# Patient Record
Sex: Female | Born: 1945
Health system: Southern US, Community
[De-identification: ages and names within clinical notes are randomized; demographics above are authoritative.]

## PROBLEM LIST (undated history)

## (undated) DIAGNOSIS — E079 Disorder of thyroid, unspecified: Secondary | ICD-10-CM

## (undated) DIAGNOSIS — C801 Malignant (primary) neoplasm, unspecified: Secondary | ICD-10-CM

## (undated) DIAGNOSIS — Z5189 Encounter for other specified aftercare: Secondary | ICD-10-CM

## (undated) DIAGNOSIS — IMO0001 Reserved for inherently not codable concepts without codable children: Secondary | ICD-10-CM

## (undated) DIAGNOSIS — I10 Essential (primary) hypertension: Secondary | ICD-10-CM

## (undated) HISTORY — DX: Malignant (primary) neoplasm, unspecified: C80.1

## (undated) HISTORY — DX: Reserved for inherently not codable concepts without codable children: IMO0001

## (undated) HISTORY — DX: Encounter for other specified aftercare: Z51.89

## (undated) HISTORY — PX: MANDIBLE SURGERY: SHX707

---

## 2000-01-14 HISTORY — PX: BREAST SURGERY: SHX581

## 2000-02-27 ENCOUNTER — Ambulatory Visit (HOSPITAL_COMMUNITY): Admission: RE | Admit: 2000-02-27 | Discharge: 2000-02-27 | Payer: Self-pay | Admitting: *Deleted

## 2001-01-23 ENCOUNTER — Encounter: Admission: RE | Admit: 2001-01-23 | Discharge: 2001-04-23 | Payer: Self-pay | Admitting: *Deleted

## 2001-01-23 ENCOUNTER — Encounter: Payer: Self-pay | Admitting: Surgery

## 2001-01-23 ENCOUNTER — Encounter: Admission: RE | Admit: 2001-01-23 | Discharge: 2001-01-23 | Payer: Self-pay | Admitting: Surgery

## 2001-02-12 ENCOUNTER — Ambulatory Visit (HOSPITAL_COMMUNITY): Admission: RE | Admit: 2001-02-12 | Discharge: 2001-02-12 | Payer: Self-pay | Admitting: Surgery

## 2001-02-12 ENCOUNTER — Encounter: Payer: Self-pay | Admitting: Surgery

## 2001-03-05 ENCOUNTER — Ambulatory Visit (HOSPITAL_COMMUNITY): Admission: RE | Admit: 2001-03-05 | Discharge: 2001-03-05 | Payer: Self-pay | Admitting: *Deleted

## 2001-03-05 ENCOUNTER — Encounter: Payer: Self-pay | Admitting: *Deleted

## 2001-03-17 ENCOUNTER — Ambulatory Visit (HOSPITAL_BASED_OUTPATIENT_CLINIC_OR_DEPARTMENT_OTHER): Admission: RE | Admit: 2001-03-17 | Discharge: 2001-03-17 | Payer: Self-pay | Admitting: Surgery

## 2001-03-17 ENCOUNTER — Encounter: Payer: Self-pay | Admitting: Surgery

## 2001-05-22 ENCOUNTER — Ambulatory Visit: Admission: RE | Admit: 2001-05-22 | Discharge: 2001-08-20 | Payer: Self-pay | Admitting: *Deleted

## 2001-12-08 ENCOUNTER — Ambulatory Visit (HOSPITAL_BASED_OUTPATIENT_CLINIC_OR_DEPARTMENT_OTHER): Admission: RE | Admit: 2001-12-08 | Discharge: 2001-12-08 | Payer: Self-pay | Admitting: Surgery

## 2003-11-16 ENCOUNTER — Ambulatory Visit (HOSPITAL_COMMUNITY): Admission: RE | Admit: 2003-11-16 | Discharge: 2003-11-16 | Payer: Self-pay | Admitting: Oncology

## 2004-11-13 ENCOUNTER — Ambulatory Visit: Payer: Self-pay | Admitting: Oncology

## 2005-03-13 ENCOUNTER — Ambulatory Visit (HOSPITAL_COMMUNITY): Admission: RE | Admit: 2005-03-13 | Discharge: 2005-03-13 | Payer: Self-pay | Admitting: *Deleted

## 2005-05-14 ENCOUNTER — Ambulatory Visit: Payer: Self-pay | Admitting: Oncology

## 2005-05-17 ENCOUNTER — Ambulatory Visit (HOSPITAL_COMMUNITY): Admission: RE | Admit: 2005-05-17 | Discharge: 2005-05-17 | Payer: Self-pay | Admitting: Oncology

## 2005-05-22 ENCOUNTER — Ambulatory Visit (HOSPITAL_COMMUNITY): Admission: RE | Admit: 2005-05-22 | Discharge: 2005-05-22 | Payer: Self-pay | Admitting: Oncology

## 2005-09-17 ENCOUNTER — Ambulatory Visit: Payer: Self-pay | Admitting: Oncology

## 2005-11-06 ENCOUNTER — Ambulatory Visit (HOSPITAL_COMMUNITY): Admission: RE | Admit: 2005-11-06 | Discharge: 2005-11-06 | Payer: Self-pay | Admitting: Plastic Surgery

## 2005-11-06 ENCOUNTER — Encounter: Admission: RE | Admit: 2005-11-06 | Discharge: 2005-11-06 | Payer: Self-pay | Admitting: Plastic Surgery

## 2005-11-12 ENCOUNTER — Ambulatory Visit: Payer: Self-pay | Admitting: Oncology

## 2006-05-29 ENCOUNTER — Ambulatory Visit: Payer: Self-pay | Admitting: Oncology

## 2006-05-31 LAB — CBC WITH DIFFERENTIAL/PLATELET
BASO%: 0.5 % (ref 0.0–2.0)
Basophils Absolute: 0 10*3/uL (ref 0.0–0.1)
EOS%: 1.2 % (ref 0.0–7.0)
HCT: 42.1 % (ref 34.8–46.6)
HGB: 14.5 g/dL (ref 11.6–15.9)
MCH: 33.6 pg (ref 26.0–34.0)
MCHC: 34.5 g/dL (ref 32.0–36.0)
MCV: 97.5 fL (ref 81.0–101.0)
NEUT#: 2.9 10*3/uL (ref 1.5–6.5)
NEUT%: 51.8 % (ref 39.6–76.8)
lymph#: 2 10*3/uL (ref 0.9–3.3)

## 2006-05-31 LAB — COMPREHENSIVE METABOLIC PANEL
AST: 26 U/L (ref 0–37)
Alkaline Phosphatase: 54 U/L (ref 39–117)
BUN: 13 mg/dL (ref 6–23)
CO2: 29 mEq/L (ref 19–32)
Calcium: 10.1 mg/dL (ref 8.4–10.5)
Creatinine, Ser: 0.8 mg/dL (ref 0.40–1.20)

## 2006-05-31 LAB — LACTATE DEHYDROGENASE: LDH: 143 U/L (ref 94–250)

## 2006-11-20 ENCOUNTER — Ambulatory Visit: Payer: Self-pay | Admitting: Oncology

## 2006-11-29 LAB — CBC WITH DIFFERENTIAL/PLATELET
BASO%: 0.4 % (ref 0.0–2.0)
HCT: 42.3 % (ref 34.8–46.6)
HGB: 14.8 g/dL (ref 11.6–15.9)
MCHC: 35 g/dL (ref 32.0–36.0)
MCV: 95.1 fL (ref 81.0–101.0)
MONO#: 0.5 10*3/uL (ref 0.1–0.9)
MONO%: 9.3 % (ref 0.0–13.0)
NEUT#: 2.7 10*3/uL (ref 1.5–6.5)
Platelets: 296 10*3/uL (ref 145–400)
WBC: 5 10*3/uL (ref 3.9–10.0)
lymph#: 1.8 10*3/uL (ref 0.9–3.3)

## 2006-11-29 LAB — COMPREHENSIVE METABOLIC PANEL
AST: 21 U/L (ref 0–37)
Albumin: 4.4 g/dL (ref 3.5–5.2)
BUN: 10 mg/dL (ref 6–23)
CO2: 27 mEq/L (ref 19–32)
Calcium: 9.6 mg/dL (ref 8.4–10.5)
Chloride: 100 mEq/L (ref 96–112)
Creatinine, Ser: 0.72 mg/dL (ref 0.40–1.20)
Glucose, Bld: 98 mg/dL (ref 70–99)
Potassium: 3.9 mEq/L (ref 3.5–5.3)
Sodium: 138 mEq/L (ref 135–145)
Total Bilirubin: 0.5 mg/dL (ref 0.3–1.2)
Total Protein: 7.1 g/dL (ref 6.0–8.3)

## 2007-05-29 ENCOUNTER — Ambulatory Visit: Payer: Self-pay | Admitting: Oncology

## 2007-06-02 LAB — CBC WITH DIFFERENTIAL/PLATELET
BASO%: 0.6 % (ref 0.0–2.0)
HCT: 41.7 % (ref 34.8–46.6)
HGB: 14.8 g/dL (ref 11.6–15.9)
LYMPH%: 37.5 % (ref 14.0–48.0)
MCHC: 35.4 g/dL (ref 32.0–36.0)
MCV: 95.3 fL (ref 81.0–101.0)
NEUT%: 51.4 % (ref 39.6–76.8)
RBC: 4.37 10*6/uL (ref 3.70–5.32)
WBC: 5.3 10*3/uL (ref 3.9–10.0)
lymph#: 2 10*3/uL (ref 0.9–3.3)

## 2007-06-02 LAB — COMPREHENSIVE METABOLIC PANEL
ALT: 27 U/L (ref 0–35)
Alkaline Phosphatase: 46 U/L (ref 39–117)
BUN: 11 mg/dL (ref 6–23)
Chloride: 102 mEq/L (ref 96–112)
Glucose, Bld: 112 mg/dL — ABNORMAL HIGH (ref 70–99)
Potassium: 3.6 mEq/L (ref 3.5–5.3)
Total Bilirubin: 0.6 mg/dL (ref 0.3–1.2)

## 2007-12-03 ENCOUNTER — Ambulatory Visit: Payer: Self-pay | Admitting: Oncology

## 2007-12-08 LAB — COMPREHENSIVE METABOLIC PANEL
Albumin: 4.4 g/dL (ref 3.5–5.2)
BUN: 13 mg/dL (ref 6–23)
Calcium: 9.5 mg/dL (ref 8.4–10.5)
Creatinine, Ser: 0.78 mg/dL (ref 0.40–1.20)
Glucose, Bld: 95 mg/dL (ref 70–99)
Sodium: 140 mEq/L (ref 135–145)
Total Bilirubin: 0.6 mg/dL (ref 0.3–1.2)

## 2007-12-08 LAB — LACTATE DEHYDROGENASE: LDH: 130 U/L (ref 94–250)

## 2007-12-08 LAB — CBC WITH DIFFERENTIAL/PLATELET
HCT: 42.4 % (ref 34.8–46.6)
HGB: 14.7 g/dL (ref 11.6–15.9)
MCH: 33.8 pg (ref 26.0–34.0)
MONO#: 0.5 10*3/uL (ref 0.1–0.9)
NEUT#: 2.8 10*3/uL (ref 1.5–6.5)
Platelets: 293 10*3/uL (ref 145–400)
WBC: 5.4 10*3/uL (ref 3.9–10.0)

## 2008-12-03 ENCOUNTER — Ambulatory Visit: Payer: Self-pay | Admitting: Oncology

## 2008-12-07 ENCOUNTER — Ambulatory Visit (HOSPITAL_COMMUNITY): Admission: RE | Admit: 2008-12-07 | Discharge: 2008-12-07 | Payer: Self-pay | Admitting: Oncology

## 2008-12-07 LAB — LACTATE DEHYDROGENASE: LDH: 119 U/L (ref 94–250)

## 2008-12-07 LAB — COMPREHENSIVE METABOLIC PANEL
AST: 17 U/L (ref 0–37)
Albumin: 4.3 g/dL (ref 3.5–5.2)
Alkaline Phosphatase: 55 U/L (ref 39–117)
BUN: 11 mg/dL (ref 6–23)
CO2: 30 mEq/L (ref 19–32)
Chloride: 101 mEq/L (ref 96–112)
Creatinine, Ser: 0.74 mg/dL (ref 0.40–1.20)
Glucose, Bld: 104 mg/dL — ABNORMAL HIGH (ref 70–99)
Potassium: 3.8 mEq/L (ref 3.5–5.3)
Total Protein: 7.1 g/dL (ref 6.0–8.3)

## 2008-12-07 LAB — CBC WITH DIFFERENTIAL/PLATELET
Basophils Absolute: 0 10*3/uL (ref 0.0–0.1)
Eosinophils Absolute: 0 10*3/uL (ref 0.0–0.5)
MCH: 33.1 pg (ref 25.1–34.0)
NEUT#: 2.8 10*3/uL (ref 1.5–6.5)
NEUT%: 58.1 % (ref 38.4–76.8)
RBC: 4.36 10*6/uL (ref 3.70–5.45)
RDW: 13.3 % (ref 11.2–14.5)

## 2009-12-01 ENCOUNTER — Ambulatory Visit (HOSPITAL_BASED_OUTPATIENT_CLINIC_OR_DEPARTMENT_OTHER): Payer: MEDICARE | Admitting: Oncology

## 2009-12-05 LAB — COMPREHENSIVE METABOLIC PANEL
ALT: 17 U/L (ref 0–35)
AST: 17 U/L (ref 0–37)
Alkaline Phosphatase: 50 U/L (ref 39–117)
BUN: 10 mg/dL (ref 6–23)
Total Bilirubin: 0.4 mg/dL (ref 0.3–1.2)
Total Protein: 7.1 g/dL (ref 6.0–8.3)

## 2009-12-05 LAB — CBC WITH DIFFERENTIAL/PLATELET
EOS%: 1.3 % (ref 0.0–7.0)
LYMPH%: 32.4 % (ref 14.0–49.7)
MCHC: 34.9 g/dL (ref 31.5–36.0)
MCV: 96.2 fL (ref 79.5–101.0)
MONO#: 0.6 10*3/uL (ref 0.1–0.9)
MONO%: 11.2 % (ref 0.0–14.0)
NEUT#: 2.7 10*3/uL (ref 1.5–6.5)
Platelets: 251 10*3/uL (ref 145–400)
RDW: 13.1 % (ref 11.2–14.5)
WBC: 5 10*3/uL (ref 3.9–10.3)

## 2010-12-05 ENCOUNTER — Encounter (HOSPITAL_BASED_OUTPATIENT_CLINIC_OR_DEPARTMENT_OTHER): Payer: MEDICARE | Admitting: Oncology

## 2010-12-05 DIAGNOSIS — Z803 Family history of malignant neoplasm of breast: Secondary | ICD-10-CM

## 2010-12-05 DIAGNOSIS — C50419 Malignant neoplasm of upper-outer quadrant of unspecified female breast: Secondary | ICD-10-CM

## 2010-12-05 DIAGNOSIS — M899 Disorder of bone, unspecified: Secondary | ICD-10-CM

## 2010-12-05 LAB — COMPREHENSIVE METABOLIC PANEL
ALT: 26 U/L (ref 0–35)
AST: 27 U/L (ref 0–37)
Calcium: 9.5 mg/dL (ref 8.4–10.5)
Chloride: 96 mEq/L (ref 96–112)
Total Protein: 6.8 g/dL (ref 6.0–8.3)

## 2010-12-05 LAB — CBC WITH DIFFERENTIAL/PLATELET
LYMPH%: 30.7 % (ref 14.0–49.7)
MCH: 33 pg (ref 25.1–34.0)
MCHC: 34.3 g/dL (ref 31.5–36.0)
MONO#: 0.4 10*3/uL (ref 0.1–0.9)
MONO%: 8.7 % (ref 0.0–14.0)
NEUT#: 2.7 10*3/uL (ref 1.5–6.5)
Platelets: 267 10*3/uL (ref 145–400)
lymph#: 1.4 10*3/uL (ref 0.9–3.3)

## 2010-12-05 LAB — LACTATE DEHYDROGENASE: LDH: 119 U/L (ref 94–250)

## 2011-02-07 ENCOUNTER — Encounter (INDEPENDENT_AMBULATORY_CARE_PROVIDER_SITE_OTHER): Payer: Self-pay | Admitting: Surgery

## 2011-03-02 NOTE — Procedures (Signed)
[facility]  Patient:    NEIDY, GUERRIERI                         MRN: [id number] Adm. Date:  [id number] Attending:  Sabino Gasser                           Procedure Report  PROCEDURE:  Colonoscopy.  ENDOSCOPIST:  Sabino Gasser, M.D.  INDICATION:  Colon polyp history with resection in the past.  ANESTHESIA:  Demerol 50 mg and Versed 6 mg were given intravenously in divided dose.  DESCRIPTION OF PROCEDURE:  With patient mildly sedated in the left lateral decubitus position, the Olympus videoscopic colonoscope was inserted in the rectum and passed under direct vision into the cecum, cecum identified by ileocecal valve and appendiceal orifice, both of which were photographed. From this point, the colonoscope was slowly withdrawn, taking circumferential views of the entire colonic mucosa, stopping only when we reached the rectum, which appeared normal on direct and retroflexed views.  The endoscope was straightened and withdrawn.  Patients vital signs and pulse oximetry remained stable.  Patient tolerated procedure well without apparent complications.  FINDINGS:  Essentially negative colonoscopic examination to the cecum.  PLAN:  Repeat exam in five years. DD:  02/15/00 TD:  02/28/00 Job: [id number]

## 2011-03-02 NOTE — Op Note (Signed)
NAME:  [name], Cheryl                  ACCOUNT NO.:  [id number]   MEDICAL RECORD NO.:  [id number]          PATIENT TYPE:  AMB   LOCATION:  ENDO                         FACILITY:  [facility]   PHYSICIAN:  Georgiana Spinner, M.D.    DATE OF BIRTH:  [birth year]   DATE OF PROCEDURE:  DATE OF DISCHARGE:                                 OPERATIVE REPORT   PROCEDURE:  Colonoscopy.   INDICATION:  Colon cancer screening.   ANESTHESIA:  Demerol 50 mg, Versed 5 mg.   DESCRIPTION OF PROCEDURE:  With the patient mildly sedated in the left  lateral decubitus position, the Olympus videoscopic colonoscope was inserted  into the rectum, passed under direct vision to the cecum identified by the  ileocecal valve and appendiceal orifice both of which were photographed.  From this point the colonoscope was slowly withdrawn, taking circumferential  views of the colonic mucosa and stopping only in the rectum which appeared  normal on direct and retroflexed view.  The endoscope was straightened and  withdrawn.  The patient's vital signs and pulse oximeter remained stable,  the patient tolerated the procedure well without apparent complications.   FINDINGS:  Occasional diverticulum in sigmoid colon, otherwise an  unremarkable colonoscopic examination to the cecum.   PLAN:  Consider repeat examination possibly in 5 years.      GMO/MEDQ  D:  03/13/2005  T:  03/13/2005  Job:  [id number]   cc:   Samul Dada, M.D.  [address] [facility]  [place]  [place] [address]  Fax: [phone]

## 2011-12-19 ENCOUNTER — Ambulatory Visit (INDEPENDENT_AMBULATORY_CARE_PROVIDER_SITE_OTHER): Payer: Medicare Other | Admitting: Surgery

## 2011-12-19 ENCOUNTER — Encounter (INDEPENDENT_AMBULATORY_CARE_PROVIDER_SITE_OTHER): Payer: Self-pay | Admitting: Surgery

## 2011-12-19 VITALS — BP 118/76 | HR 68 | Temp 97.8°F | Resp 18 | Ht 60.0 in | Wt 137.2 lb

## 2011-12-19 DIAGNOSIS — Z853 Personal history of malignant neoplasm of breast: Secondary | ICD-10-CM | POA: Insufficient documentation

## 2011-12-19 NOTE — Progress Notes (Signed)
[facility]  Ovidio Kin, MD,  FACS [address] [place]  [address] [place], [place]    [address] Phone:  [phone] FAX:  [phone]   Re:   Cheryl [name] DOB:   June 17, 1946 MRN:   [id number]  ASSESSMENT AND PLAN: 1.  Left breast cancer, lumpectomy May 2002  1.7 cm IDC with 0/2 nodes  ER/PR positve, Her2Neu - neg.  She took tamoxifen foe 3 years - then Femara.  She stopped the Femara in 2007.  She saw Dr. Aliene Altes first, then Dr. Arline Asp.  He has released her.  She had Rad Tx supervised by Dr. Jackelyn Knife.  She is disease free and will see me back in one year.  HISTORY OF PRESENT ILLNESS: Chief Complaint  Patient presents with  . Breast Cancer [place] Term Follow Up    Recheck    [name] is a 66 y.o. (DOB: [birth year])  white female who is a patient of PANG,RICHARD, MD, MD and comes to me today for left breast cancer follow up.  She is doing well with no complaints.  She has no new mass or concern.  PHYSICAL EXAM: BP 118/76  Pulse 68  Temp(Src) 97.8 F (36.6 C) (Temporal)  Resp 18  Ht 5' (1.524 m)  Wt 137 lb 3.2 oz (62.234 kg)  BMI 26.80 kg/m2  HEENT:  Pupils equal.  Dentition good.  No injury. NECK:  Supple.  No thyroid mass. LYMPH NODES:  No cervical, supraclavicular, or axillary adenopathy. BREASTS -  RIGHT:  Has changes of breast reduction. No palpable mass or nodule.  No nipple discharge.   LEFT: Defect UOQ of left breast with thickening under the scar.  No nipple discharge. UPPER EXTREMITIES:  No evidence of lymphedema.  DATA REVIEWED: Mammogram - [facility] - 09/27/2011 was negative.   Ovidio Kin, MD, FACS Office:  [phone]

## 2011-12-24 ENCOUNTER — Encounter (INDEPENDENT_AMBULATORY_CARE_PROVIDER_SITE_OTHER): Payer: Self-pay

## 2012-12-17 ENCOUNTER — Telehealth (INDEPENDENT_AMBULATORY_CARE_PROVIDER_SITE_OTHER): Payer: Self-pay

## 2012-12-17 NOTE — Telephone Encounter (Signed)
Pt aware of appt with Dr. Ezzard Standing 01/07/13@ 10:15

## 2013-01-07 ENCOUNTER — Ambulatory Visit (INDEPENDENT_AMBULATORY_CARE_PROVIDER_SITE_OTHER): Payer: Medicare Other | Admitting: Surgery

## 2013-01-07 ENCOUNTER — Encounter (INDEPENDENT_AMBULATORY_CARE_PROVIDER_SITE_OTHER): Payer: Self-pay | Admitting: Surgery

## 2013-01-07 VITALS — BP 140/64 | HR 72 | Temp 98.4°F | Resp 16 | Ht 60.0 in | Wt 138.0 lb

## 2013-01-07 DIAGNOSIS — Z853 Personal history of malignant neoplasm of breast: Secondary | ICD-10-CM

## 2013-01-07 NOTE — Progress Notes (Signed)
[facility]  Cheryl Kin, MD,  FACS [address] [place]  [address] [place], [place]    [address] Phone:  [phone] FAX:  [phone]   Re:   Cheryl [name] DOB:   [birth year] MRN:   [id number]  ASSESSMENT AND PLAN: 1.  Left breast cancer, lumpectomy May 2002  1.7 cm IDC with 0/2 nodes  ER/PR positve, Her2Neu - neg.  She took tamoxifen for 3 years - then Femara.  She stopped the Femara in 2007.  She saw Dr. Aliene Altes first, then Dr. Arline Asp.  He has released her.  She had Rad Tx supervised by Dr. Jackelyn Knife.  She is disease free and will see me back in one year.  HISTORY OF PRESENT ILLNESS: Chief Complaint  Patient presents with  . Breast Cancer [place] Term Follow Up   Cheryl [name] is a 67 y.o. (DOB: 03-Nov-1945)  white female who is a patient of PANG,RICHARD, MD and comes to me today for left breast cancer follow up.  She is doing well with no complaints.  She has no new mass or concern.  We talked about the continued cold weather.  PHYSICAL EXAM: BP 140/64  Pulse 72  Temp(Src) 98.4 F (36.9 C) (Temporal)  Resp 16  Ht 5' (1.524 m)  Wt 138 lb (62.596 kg)  BMI 26.95 kg/m2  HEENT:  Pupils equal.  Dentition good.   NECK:  Supple.  No thyroid mass. LYMPH NODES:  No cervical, supraclavicular, or axillary adenopathy. BREASTS -  RIGHT:  Has changes of breast reduction. No palpable mass or nodule.  No nipple discharge.   LEFT: Defect UOQ of left breast with thickening under the scar.  No nipple discharge. UPPER EXTREMITIES:  No evidence of lymphedema.  DATA REVIEWED: Mammogram - [facility] - 09/29/2012 - negative.  Next mammogram in 1 year.  Cheryl Kin, MD, FACS Office:  [phone]

## 2013-02-09 ENCOUNTER — Encounter (INDEPENDENT_AMBULATORY_CARE_PROVIDER_SITE_OTHER): Payer: Self-pay

## 2013-12-11 ENCOUNTER — Encounter (INDEPENDENT_AMBULATORY_CARE_PROVIDER_SITE_OTHER): Payer: Self-pay

## 2014-01-02 ENCOUNTER — Encounter (HOSPITAL_BASED_OUTPATIENT_CLINIC_OR_DEPARTMENT_OTHER): Payer: Self-pay | Admitting: Emergency Medicine

## 2014-01-02 ENCOUNTER — Emergency Department (HOSPITAL_BASED_OUTPATIENT_CLINIC_OR_DEPARTMENT_OTHER): Payer: Medicare Other

## 2014-01-02 ENCOUNTER — Emergency Department (HOSPITAL_BASED_OUTPATIENT_CLINIC_OR_DEPARTMENT_OTHER)
Admission: EM | Admit: 2014-01-02 | Discharge: 2014-01-02 | Disposition: A | Discharge status: Home / Self Care | Payer: Medicare Other | Attending: Emergency Medicine | Admitting: Emergency Medicine

## 2014-01-02 DIAGNOSIS — K5732 Diverticulitis of large intestine without perforation or abscess without bleeding: Secondary | ICD-10-CM | POA: Insufficient documentation

## 2014-01-02 DIAGNOSIS — E079 Disorder of thyroid, unspecified: Secondary | ICD-10-CM | POA: Insufficient documentation

## 2014-01-02 DIAGNOSIS — Z853 Personal history of malignant neoplasm of breast: Secondary | ICD-10-CM | POA: Insufficient documentation

## 2014-01-02 DIAGNOSIS — Z79899 Other long term (current) drug therapy: Secondary | ICD-10-CM | POA: Insufficient documentation

## 2014-01-02 DIAGNOSIS — I10 Essential (primary) hypertension: Secondary | ICD-10-CM | POA: Insufficient documentation

## 2014-01-02 DIAGNOSIS — K5792 Diverticulitis of intestine, part unspecified, without perforation or abscess without bleeding: Secondary | ICD-10-CM

## 2014-01-02 DIAGNOSIS — Z7982 Long term (current) use of aspirin: Secondary | ICD-10-CM | POA: Insufficient documentation

## 2014-01-02 HISTORY — DX: Disorder of thyroid, unspecified: E07.9

## 2014-01-02 HISTORY — DX: Essential (primary) hypertension: I10

## 2014-01-02 LAB — COMPREHENSIVE METABOLIC PANEL
ALT: 14 U/L (ref 0–35)
AST: 14 U/L (ref 0–37)
Albumin: 3.6 g/dL (ref 3.5–5.2)
Alkaline Phosphatase: 49 U/L (ref 39–117)
BUN: 10 mg/dL (ref 6–23)
CALCIUM: 9.5 mg/dL (ref 8.4–10.5)
CO2: 30 mEq/L (ref 19–32)
Chloride: 95 mEq/L — ABNORMAL LOW (ref 96–112)
Creatinine, Ser: 0.7 mg/dL (ref 0.50–1.10)
GFR calc Af Amer: >90 mL/min (ref >90)
GFR calc non Af Amer: 86 mL/min — ABNORMAL LOW (ref >90)
GLUCOSE: 123 mg/dL — AB (ref 70–99)
Potassium: 3.2 mEq/L — ABNORMAL LOW (ref 3.7–5.3)
SODIUM: 139 meq/L (ref 137–147)
Total Bilirubin: 1.3 mg/dL — ABNORMAL HIGH (ref 0.3–1.2)
Total Protein: 8.1 g/dL (ref 6.0–8.3)

## 2014-01-02 LAB — CBC
HCT: 41.3 % (ref 36.0–46.0)
Hemoglobin: 14.1 g/dL (ref 12.0–15.0)
MCH: 33 pg (ref 26.0–34.0)
MCHC: 34.1 g/dL (ref 30.0–36.0)
MCV: 96.7 fL (ref 78.0–100.0)
PLATELETS: 251 10*3/uL (ref 150–400)
RBC: 4.27 MIL/uL (ref 3.87–5.11)
RDW: 12.7 % (ref 11.5–15.5)
WBC: 12 10*3/uL — ABNORMAL HIGH (ref 4.0–10.5)

## 2014-01-02 LAB — URINALYSIS, ROUTINE W REFLEX MICROSCOPIC
Glucose, UA: NEGATIVE mg/dL
Hgb urine dipstick: NEGATIVE
KETONES UR: 15 mg/dL — AB
NITRITE: NEGATIVE
PH: 8 (ref 5.0–8.0)
PROTEIN: 30 mg/dL — AB
Specific Gravity, Urine: 1.025 (ref 1.005–1.030)
Urobilinogen, UA: 1 mg/dL (ref 0.0–1.0)

## 2014-01-02 LAB — URINE MICROSCOPIC-ADD ON

## 2014-01-02 LAB — LIPASE, BLOOD: Lipase: 22 U/L (ref 11–59)

## 2014-01-02 MED ORDER — CIPROFLOXACIN HCL 500 MG PO TABS: 500.0000 mg | ORAL_TABLET | Freq: Two times a day (BID) | ORAL | Status: DC

## 2014-01-02 MED ORDER — IOHEXOL 300 MG/ML  SOLN
100.0000 mL | Freq: Once | INTRAMUSCULAR | Status: AC | PRN
  Administered 2014-01-02: 100 mL via INTRAVENOUS

## 2014-01-02 MED ORDER — METRONIDAZOLE IN NACL 5-0.79 MG/ML-% IV SOLN
500.0000 mg | Freq: Once | INTRAVENOUS | Status: AC
  Administered 2014-01-02: 500 mg via INTRAVENOUS
  Filled 2014-01-02: qty 100

## 2014-01-02 MED ORDER — IOHEXOL 300 MG/ML  SOLN
50.0000 mL | Freq: Once | INTRAMUSCULAR | Status: AC | PRN
  Administered 2014-01-02: 50 mL via ORAL

## 2014-01-02 MED ORDER — HYDROCODONE-ACETAMINOPHEN 5-325 MG PO TABS: 1.0000 | ORAL_TABLET | Freq: Four times a day (QID) | ORAL | Status: DC | PRN

## 2014-01-02 MED ORDER — SODIUM CHLORIDE 0.9 % IV BOLUS (SEPSIS)
1000.0000 mL | Freq: Once | INTRAVENOUS | Status: AC
  Administered 2014-01-02: 1000 mL via INTRAVENOUS

## 2014-01-02 MED ORDER — METRONIDAZOLE 500 MG PO TABS: 500.0000 mg | ORAL_TABLET | Freq: Three times a day (TID) | ORAL | Status: DC

## 2014-01-02 MED ORDER — CIPROFLOXACIN IN D5W 400 MG/200ML IV SOLN
400.0000 mg | Freq: Once | INTRAVENOUS | Status: AC
  Administered 2014-01-02: 400 mg via INTRAVENOUS
  Filled 2014-01-02: qty 200

## 2014-01-02 NOTE — Discharge Instructions (Signed)
Diverticulitis °A diverticulum is a small pouch or sac on the colon. Diverticulosis is the presence of these diverticula on the colon. Diverticulitis is the irritation (inflammation) or infection of diverticula. °CAUSES  °The colon and its diverticula contain bacteria. If food particles block the tiny opening to a diverticulum, the bacteria inside can grow and cause an increase in pressure. This leads to infection and inflammation and is called diverticulitis. °SYMPTOMS  °· Abdominal pain and tenderness. Usually, the pain is located on the left side of your abdomen. However, it could be located elsewhere. °· Fever. °· Bloating. °· Feeling sick to your stomach (nausea). °· Throwing up (vomiting). °· Abnormal stools. °DIAGNOSIS  °Your caregiver will take a history and perform a physical exam. Since many things can cause abdominal pain, other tests may be necessary. Tests may include: °· Blood tests. °· Urine tests. °· X-ray of the abdomen. °· CT scan of the abdomen. °Sometimes, surgery is needed to determine if diverticulitis or other conditions are causing your symptoms. °TREATMENT  °Most of the time, you can be treated without surgery. Treatment includes: °· Resting the bowels by only having liquids for a few days. As you improve, you will need to eat a low-fiber diet. °· Intravenous (IV) fluids if you are losing body fluids (dehydrated). °· Antibiotic medicines that treat infections may be given. °· Pain and nausea medicine, if needed. °· Surgery if the inflamed diverticulum has burst. °HOME CARE INSTRUCTIONS  °· Try a clear liquid diet (broth, tea, or water for as [place] as directed by your caregiver). You may then gradually begin a low-fiber diet as tolerated.  °A low-fiber diet is a diet with less than 10 grams of fiber. Choose the foods below to reduce fiber in the diet: °· White breads, cereals, rice, and pasta. °· Cooked fruits and vegetables or soft fresh fruits and vegetables without the skin. °· Ground or  well-cooked tender beef, ham, veal, lamb, pork, or poultry. °· Eggs and seafood. °· After your diverticulitis symptoms have improved, your caregiver may put you on a high-fiber diet. A high-fiber diet includes 14 grams of fiber for every 1000 calories consumed. For a standard 2000 calorie diet, you would need 28 grams of fiber. Follow these diet guidelines to help you increase the fiber in your diet. It is important to slowly increase the amount fiber in your diet to avoid gas, constipation, and bloating. °· Choose whole-grain breads, cereals, pasta, and brown rice. °· Choose fresh fruits and vegetables with the skin on. Do not overcook vegetables because the more vegetables are cooked, the more fiber is lost. °· Choose more nuts, seeds, legumes, dried peas, beans, and lentils. °· Look for food products that have greater than 3 grams of fiber per serving on the Nutrition Facts label. °· Take all medicine as directed by your caregiver. °· If your caregiver has given you a follow-up appointment, it is very important that you go. Not going could result in lasting (chronic) or permanent injury, pain, and disability. If there is any problem keeping the appointment, call to reschedule. °SEEK MEDICAL CARE IF:  °· Your pain does not improve. °· You have a hard time advancing your diet beyond clear liquids. °· Your bowel movements do not return to normal. °SEEK IMMEDIATE MEDICAL CARE IF:  °· Your pain becomes worse. °· You have an oral temperature above 102° F (38.9° C), not controlled by medicine. °· You have repeated vomiting. °· You have bloody or black, tarry stools. °·   Symptoms that brought you to your caregiver become worse or are not getting better. °MAKE SURE YOU:  °· Understand these instructions. °· Will watch your condition. °· Will get help right away if you are not doing well or get worse. °Document Released: 07/11/2005 Document Revised: 12/24/2011 Document Reviewed: 11/06/2010 °[employer]® Patient Information  ©2014 [employer]. ° °

## 2014-01-02 NOTE — ED Notes (Signed)
Patient c/o LUQ pain since Thur, no n/v/d, no constipation, tender,

## 2014-01-02 NOTE — ED Provider Notes (Signed)
CSN: [id number]     Arrival date & time 01/02/14  3762 History   First MD Initiated Contact with Patient 01/02/14 [phone]     Chief Complaint  Patient presents with  . Abdominal Pain     (Consider location/radiation/quality/duration/timing/severity/associated sxs/prior Treatment) Patient is a 68 y.o. female presenting with abdominal pain. The history is provided by the patient.  Abdominal Pain Pain location:  LUQ Pain quality: aching   Pain radiates to:  Does not radiate Pain severity:  Moderate Onset quality:  Sudden Duration:  2 days Timing:  Constant Progression:  Worsening Chronicity:  New Context: not alcohol use, not eating and not trauma   Relieved by:  Position changes Worsened by:  Nothing tried Ineffective treatments:  None tried Associated symptoms: fever (Tmax 100.6)   Associated symptoms: no chest pain, no constipation, no cough, no dysuria, no nausea, no shortness of breath, no vaginal bleeding, no vaginal discharge and no vomiting     Past Medical History  Diagnosis Date  . Blood transfusion   . Cancer     breast - left  . Thyroid disease   . Hypertension    Past Surgical History  Procedure Laterality Date  . Mandible surgery    . Breast surgery  01/2000    left   Family History  Problem Relation Age of Onset  . Heart disease Mother   . Heart disease Father   . Breast cancer Sister   . Cancer Sister     breast   History  Substance Use Topics  . Smoking status: Never Smoker   . Smokeless tobacco: Never Used  . Alcohol Use: 0.0 oz/week    0 Glasses of wine per week     Comment: wine - rare   OB History   Grav Para Term Preterm Abortions TAB SAB Ect Mult Living                 Review of Systems  Constitutional: Positive for fever (Tmax 100.6).  Respiratory: Negative for cough and shortness of breath.   Cardiovascular: Negative for chest pain.  Gastrointestinal: Positive for abdominal pain. Negative for nausea, vomiting, constipation and  blood in stool.  Genitourinary: Negative for dysuria, flank pain, vaginal bleeding, vaginal discharge and difficulty urinating.  All other systems reviewed and are negative.      Allergies  Review of patient's allergies indicates no known allergies.  Home Medications   Current Outpatient Rx  Name  Route  Sig  Dispense  Refill  . aspirin 81 MG tablet   Oral   Take 81 mg by mouth daily.           . Cinnamon 500 MG capsule   Oral   Take 1,000 mg by mouth daily.           . Flaxseed, Linseed, 1000 MG CAPS   Oral   Take by mouth 1 dose over 46 hours.           . hydrochlorothiazide 25 MG tablet   Oral   Take 25 mg by mouth daily.           Marland Kitchen levothyroxine (SYNTHROID, LEVOTHROID) 50 MCG tablet   Oral   Take 50 mcg by mouth daily.            BP 139/77  Pulse 118  Temp(Src) 98.4 F (36.9 C)  Resp 19  SpO2 98% Physical Exam  Nursing note and vitals reviewed. Constitutional: She is oriented to person, place, and  time. She appears well-developed and well-nourished. No distress.  HENT:  Head: Normocephalic and atraumatic.  Eyes: EOM are normal. Pupils are equal, round, and reactive to light.  Neck: Normal range of motion. Neck supple.  Cardiovascular: Normal rate and regular rhythm.  Exam reveals no friction rub.   No murmur heard. Pulmonary/Chest: Effort normal and breath sounds normal. No respiratory distress. She has no wheezes. She has no rales.  Abdominal: Soft. She exhibits no distension. There is tenderness (LUQ). There is no rebound.  No CVA tenderness  Musculoskeletal: Normal range of motion. She exhibits no edema.  Neurological: She is alert and oriented to person, place, and time.  Skin: She is not diaphoretic.    ED Course  Procedures (including critical care time) Labs Review Labs Reviewed  URINALYSIS, ROUTINE W REFLEX MICROSCOPIC  CBC  COMPREHENSIVE METABOLIC PANEL  LIPASE, BLOOD  I-STAT CG4 LACTIC ACID, ED   Imaging Review Dg Chest 2  View  01/02/2014   CLINICAL DATA:  Left upper quadrant pain for 2 days, history breast cancer, hypertension  EXAM: CHEST  2 VIEW  COMPARISON:  12/07/2008  FINDINGS: Normal heart size, mediastinal contours, and pulmonary vascularity.  Mild chronic peribronchial thickening.  No acute infiltrate, pleural effusion or pneumothorax.  Bones unremarkable.  IMPRESSION: Mild chronic bronchitic changes without infiltrate.   Electronically Signed   By: Lavonia Dana M.D.   On: 01/02/2014 10:06   Ct Abdomen Pelvis W Contrast  01/02/2014   CLINICAL DATA:  LUQ pain  EXAM: CT ABDOMEN AND PELVIS WITH CONTRAST  TECHNIQUE: Multidetector CT imaging of the abdomen and pelvis was performed using the standard protocol following bolus administration of intravenous contrast.  CONTRAST:  14mL OMNIPAQUE IOHEXOL 300 MG/ML SOLN, 175mL OMNIPAQUE IOHEXOL 300 MG/ML SOLN  COMPARISON:  None.  FINDINGS: The lung bases are unremarkable.  The liver demonstrates a diffuse low attenuating architecture otherwise unremarkable.  The spleen, adrenals, pancreas, kidneys are unremarkable.  There is bowel wall thickening involving the splenic flexure region of the colon. Inflammatory changes appreciated within the surrounding fat as well as a small amount of free fluid. No associated loculated fluid collections nor free air. There are regions of diverticulosis within the splenic flexure, descending and sigmoid portions of the colon.  There is no evidence of bowel obstruction, enteritis, nor appendicitis. The appendix is identified and is unremarkable.  There is no evidence of abdominal or pelvic masses, free fluid, loculated fluid collections. Scattered subcentimeter sized mesenteric lymph nodes and an 8 mm. Aortic lymph node appreciated.  There is no evidence of abdominal aortic aneurysm. Atherosclerotic calcifications are appreciated within the abdominal aorta as well as mural thrombus. The celiac, SMA, IMA, portal vein are opacified.  There is no evidence  of an abdominal wall nor inguinal hernia. The gallbladder fossa is unremarkable.  There is no evidence of aggressive appearing osseous lesions.  IMPRESSION: 1. Findings consistent with diverticulitis in the splenic flexure of the colon. There is no associated abscess nor pneumoperitoneum. 2. Reactive lymph nodes within the abdomen. 3. Hepatic steatosis 4. These results will be called to the ordering clinician or representative by the Radiologist Assistant, and communication documented in the PACS Dashboard.   Electronically Signed   By: Margaree Mackintosh M.D.   On: 01/02/2014 10:04     EKG Interpretation None      MDM   Final diagnoses:  Diverticulitis    68F presents with LUQ, presents for past 2 days. Worsening. No radiation, no migration. Denies vomiting,  diarrhea, dysuria, constipation, blood in stool. Associated fever to 100.6. Alleviating with position changes, no exacerbating factors. Afebrile, tachycardic up to 120, normotensive. On exam, LUQ tenderness without rebound or guarding. No CVA tenderness, no other abdominal pain. No cough or SOB, lungs clear. Will scan, check labs, obtain CXR. Patient does not want any pain medicine on initial eval. CT shows diverticulitis. Given Cipro and flagyl IV - can transition to PO and be discharged. Instructed to f/u with PCP.  I have reviewed all labs and imaging and considered them in my medical decision making.    Osvaldo Shipper, MD 01/02/14 1550

## 2014-01-04 LAB — I-STAT CG4 LACTIC ACID, ED: LACTIC ACID, VENOUS: 1.38 mmol/L (ref 0.5–2.2)

## 2014-02-17 ENCOUNTER — Other Ambulatory Visit (INDEPENDENT_AMBULATORY_CARE_PROVIDER_SITE_OTHER): Payer: Self-pay

## 2014-02-17 MED ORDER — UNABLE TO FIND: Status: AC

## 2014-04-21 ENCOUNTER — Encounter (INDEPENDENT_AMBULATORY_CARE_PROVIDER_SITE_OTHER): Payer: Self-pay | Admitting: Surgery

## 2014-04-21 ENCOUNTER — Ambulatory Visit (INDEPENDENT_AMBULATORY_CARE_PROVIDER_SITE_OTHER): Payer: Medicare Other | Admitting: Surgery

## 2014-04-21 VITALS — BP 126/80 | HR 93 | Ht 59.0 in | Wt 132.0 lb

## 2014-04-21 DIAGNOSIS — Z853 Personal history of malignant neoplasm of breast: Secondary | ICD-10-CM

## 2014-04-21 NOTE — Progress Notes (Signed)
Jack, MD,  [address]  [place], [place]    [place] Phone:  [phone] FAX:  [phone]   Re:   CLEOTA PELLERITO DOB:   12-18-1945 MRN:   [id number]  ASSESSMENT AND PLAN: 1.  Left breast cancer, lumpectomy May 2002  1.7 cm IDC with 0/2 nodes  ER/PR positve, Her2Neu - neg.  She took tamoxifen for 3 years - then Femara.  She stopped the Femara in 2007.  She saw Dr. Nadene Rubins first, then Dr. Ralene Ok.  He has released her.  She had Rad Tx supervised by Dr. Shelby Dubin.  She is now 13 years out from surgery.  I made this is her last visit.  I encouraged her to make sure that Dr. Minna Antis examines her breast on an annual basis.  She will call for any further problem.  HISTORY OF PRESENT ILLNESS: Chief Complaint  Patient presents with  . Breast Cancer [place] Term Follow Up   LILEIGH FAHRINGER is a 68 y.o. (DOB: 1946-08-19)  white female who is a patient of PANG,RICHARD, MD and comes to me today for left breast cancer follow up.  She is doing well with no complaints.  She has no new mass or concern.  I talked to her about follow up. She is beyond 10 years, is disease free, and there is no reason for her continued follow up with me. I reminded her to check herself, get annual mammograms, and have her PCP examine her breasts annually.  PHYSICAL EXAM: BP 126/80  Pulse 93  Ht 4\' 11"  (1.499 m)  Wt 132 lb (59.875 kg)  BMI 26.65 kg/m2  HEENT:  Pupils equal.  Dentition good.   NECK:  Supple.  No thyroid mass. LYMPH NODES:  No cervical, supraclavicular, or axillary adenopathy. BREASTS -  RIGHT:  Has changes of breast reduction. No palpable mass or nodule.  No nipple discharge.   LEFT: Defect UOQ of left breast with thickening under the scar.  No nipple discharge. UPPER EXTREMITIES:  No evidence of lymphedema.  DATA REVIEWED: Mammogram - [facility] - 09/27/2013 - negative.  Next mammogram in 1 year.  Alphonsa Overall, MD, [place] Office:   [phone]

## 2014-12-07 DIAGNOSIS — H3531 Nonexudative age-related macular degeneration: Secondary | ICD-10-CM | POA: Diagnosis not present

## 2014-12-07 DIAGNOSIS — H40013 Open angle with borderline findings, low risk, bilateral: Secondary | ICD-10-CM | POA: Diagnosis not present

## 2014-12-07 DIAGNOSIS — H02831 Dermatochalasis of right upper eyelid: Secondary | ICD-10-CM | POA: Diagnosis not present

## 2014-12-07 DIAGNOSIS — H02834 Dermatochalasis of left upper eyelid: Secondary | ICD-10-CM | POA: Diagnosis not present

## 2014-12-07 DIAGNOSIS — H43812 Vitreous degeneration, left eye: Secondary | ICD-10-CM | POA: Diagnosis not present

## 2015-01-31 DIAGNOSIS — H10411 Chronic giant papillary conjunctivitis, right eye: Secondary | ICD-10-CM | POA: Diagnosis not present

## 2015-01-31 DIAGNOSIS — H10021 Other mucopurulent conjunctivitis, right eye: Secondary | ICD-10-CM | POA: Diagnosis not present

## 2015-01-31 DIAGNOSIS — H0012 Chalazion right lower eyelid: Secondary | ICD-10-CM | POA: Diagnosis not present

## 2015-08-23 DIAGNOSIS — Z86010 Personal history of colonic polyps: Secondary | ICD-10-CM | POA: Diagnosis not present

## 2015-08-23 DIAGNOSIS — D126 Benign neoplasm of colon, unspecified: Secondary | ICD-10-CM | POA: Diagnosis not present

## 2015-08-23 DIAGNOSIS — Z09 Encounter for follow-up examination after completed treatment for conditions other than malignant neoplasm: Secondary | ICD-10-CM | POA: Diagnosis not present

## 2015-08-23 DIAGNOSIS — D124 Benign neoplasm of descending colon: Secondary | ICD-10-CM | POA: Diagnosis not present

## 2015-08-23 DIAGNOSIS — K5730 Diverticulosis of large intestine without perforation or abscess without bleeding: Secondary | ICD-10-CM | POA: Diagnosis not present

## 2015-08-25 DIAGNOSIS — E559 Vitamin D deficiency, unspecified: Secondary | ICD-10-CM | POA: Diagnosis not present

## 2015-08-25 DIAGNOSIS — I10 Essential (primary) hypertension: Secondary | ICD-10-CM | POA: Diagnosis not present

## 2015-08-25 DIAGNOSIS — Z0001 Encounter for general adult medical examination with abnormal findings: Secondary | ICD-10-CM | POA: Diagnosis not present

## 2015-09-01 DIAGNOSIS — E039 Hypothyroidism, unspecified: Secondary | ICD-10-CM | POA: Diagnosis not present

## 2015-09-01 DIAGNOSIS — E559 Vitamin D deficiency, unspecified: Secondary | ICD-10-CM | POA: Diagnosis not present

## 2015-09-01 DIAGNOSIS — M8580 Other specified disorders of bone density and structure, unspecified site: Secondary | ICD-10-CM | POA: Diagnosis not present

## 2015-09-01 DIAGNOSIS — I10 Essential (primary) hypertension: Secondary | ICD-10-CM | POA: Diagnosis not present

## 2015-09-01 DIAGNOSIS — M859 Disorder of bone density and structure, unspecified: Secondary | ICD-10-CM | POA: Diagnosis not present

## 2015-09-02 DIAGNOSIS — Z853 Personal history of malignant neoplasm of breast: Secondary | ICD-10-CM | POA: Diagnosis not present

## 2015-09-02 DIAGNOSIS — Z1231 Encounter for screening mammogram for malignant neoplasm of breast: Secondary | ICD-10-CM | POA: Diagnosis not present

## 2015-09-20 DIAGNOSIS — Z853 Personal history of malignant neoplasm of breast: Secondary | ICD-10-CM | POA: Diagnosis not present

## 2015-09-20 DIAGNOSIS — Z01419 Encounter for gynecological examination (general) (routine) without abnormal findings: Secondary | ICD-10-CM | POA: Diagnosis not present

## 2015-12-15 DIAGNOSIS — H35372 Puckering of macula, left eye: Secondary | ICD-10-CM | POA: Diagnosis not present

## 2015-12-15 DIAGNOSIS — H02831 Dermatochalasis of right upper eyelid: Secondary | ICD-10-CM | POA: Diagnosis not present

## 2015-12-15 DIAGNOSIS — H40013 Open angle with borderline findings, low risk, bilateral: Secondary | ICD-10-CM | POA: Diagnosis not present

## 2015-12-15 DIAGNOSIS — H02834 Dermatochalasis of left upper eyelid: Secondary | ICD-10-CM | POA: Diagnosis not present

## 2016-08-29 DIAGNOSIS — I10 Essential (primary) hypertension: Secondary | ICD-10-CM | POA: Diagnosis not present

## 2016-08-29 DIAGNOSIS — E039 Hypothyroidism, unspecified: Secondary | ICD-10-CM | POA: Diagnosis not present

## 2016-08-29 DIAGNOSIS — E559 Vitamin D deficiency, unspecified: Secondary | ICD-10-CM | POA: Diagnosis not present

## 2016-09-04 DIAGNOSIS — Z1231 Encounter for screening mammogram for malignant neoplasm of breast: Secondary | ICD-10-CM | POA: Diagnosis not present

## 2016-09-04 DIAGNOSIS — Z853 Personal history of malignant neoplasm of breast: Secondary | ICD-10-CM | POA: Diagnosis not present

## 2016-09-18 DIAGNOSIS — I10 Essential (primary) hypertension: Secondary | ICD-10-CM | POA: Diagnosis not present

## 2016-09-18 DIAGNOSIS — M899 Disorder of bone, unspecified: Secondary | ICD-10-CM | POA: Diagnosis not present

## 2016-09-18 DIAGNOSIS — Z0000 Encounter for general adult medical examination without abnormal findings: Secondary | ICD-10-CM | POA: Diagnosis not present

## 2016-09-18 DIAGNOSIS — E039 Hypothyroidism, unspecified: Secondary | ICD-10-CM | POA: Diagnosis not present

## 2016-09-25 DIAGNOSIS — C50912 Malignant neoplasm of unspecified site of left female breast: Secondary | ICD-10-CM | POA: Diagnosis not present

## 2016-10-03 DIAGNOSIS — C50912 Malignant neoplasm of unspecified site of left female breast: Secondary | ICD-10-CM | POA: Diagnosis not present

## 2017-03-26 DIAGNOSIS — H10021 Other mucopurulent conjunctivitis, right eye: Secondary | ICD-10-CM | POA: Diagnosis not present

## 2017-04-04 DIAGNOSIS — Z01419 Encounter for gynecological examination (general) (routine) without abnormal findings: Secondary | ICD-10-CM | POA: Diagnosis not present

## 2017-07-17 DIAGNOSIS — D2271 Melanocytic nevi of right lower limb, including hip: Secondary | ICD-10-CM | POA: Diagnosis not present

## 2017-07-17 DIAGNOSIS — L821 Other seborrheic keratosis: Secondary | ICD-10-CM | POA: Diagnosis not present

## 2017-07-17 DIAGNOSIS — D225 Melanocytic nevi of trunk: Secondary | ICD-10-CM | POA: Diagnosis not present

## 2017-07-17 DIAGNOSIS — D224 Melanocytic nevi of scalp and neck: Secondary | ICD-10-CM | POA: Diagnosis not present

## 2017-07-20 DIAGNOSIS — Z23 Encounter for immunization: Secondary | ICD-10-CM | POA: Diagnosis not present

## 2017-09-04 DIAGNOSIS — Z1231 Encounter for screening mammogram for malignant neoplasm of breast: Secondary | ICD-10-CM | POA: Diagnosis not present

## 2017-09-04 DIAGNOSIS — Z853 Personal history of malignant neoplasm of breast: Secondary | ICD-10-CM | POA: Diagnosis not present

## 2017-09-17 DIAGNOSIS — I10 Essential (primary) hypertension: Secondary | ICD-10-CM | POA: Diagnosis not present

## 2017-09-17 DIAGNOSIS — Z0000 Encounter for general adult medical examination without abnormal findings: Secondary | ICD-10-CM | POA: Diagnosis not present

## 2017-09-17 DIAGNOSIS — E559 Vitamin D deficiency, unspecified: Secondary | ICD-10-CM | POA: Diagnosis not present

## 2017-09-25 DIAGNOSIS — C50912 Malignant neoplasm of unspecified site of left female breast: Secondary | ICD-10-CM | POA: Diagnosis not present

## 2017-09-26 DIAGNOSIS — E039 Hypothyroidism, unspecified: Secondary | ICD-10-CM | POA: Diagnosis not present

## 2017-09-26 DIAGNOSIS — I10 Essential (primary) hypertension: Secondary | ICD-10-CM | POA: Diagnosis not present

## 2017-09-26 DIAGNOSIS — Z0000 Encounter for general adult medical examination without abnormal findings: Secondary | ICD-10-CM | POA: Diagnosis not present

## 2017-09-26 DIAGNOSIS — H6123 Impacted cerumen, bilateral: Secondary | ICD-10-CM | POA: Diagnosis not present

## 2017-09-26 DIAGNOSIS — Z780 Asymptomatic menopausal state: Secondary | ICD-10-CM | POA: Diagnosis not present

## 2017-09-27 DIAGNOSIS — Z780 Asymptomatic menopausal state: Secondary | ICD-10-CM | POA: Diagnosis not present

## 2017-09-27 DIAGNOSIS — M8580 Other specified disorders of bone density and structure, unspecified site: Secondary | ICD-10-CM | POA: Diagnosis not present

## 2018-02-28 DIAGNOSIS — L03115 Cellulitis of right lower limb: Secondary | ICD-10-CM | POA: Diagnosis not present

## 2018-03-13 DIAGNOSIS — I10 Essential (primary) hypertension: Secondary | ICD-10-CM | POA: Diagnosis not present

## 2018-03-13 DIAGNOSIS — W010XXA Fall on same level from slipping, tripping and stumbling without subsequent striking against object, initial encounter: Secondary | ICD-10-CM | POA: Diagnosis not present

## 2018-03-13 DIAGNOSIS — Z87891 Personal history of nicotine dependence: Secondary | ICD-10-CM | POA: Diagnosis not present

## 2018-03-13 DIAGNOSIS — R600 Localized edema: Secondary | ICD-10-CM | POA: Diagnosis not present

## 2018-03-13 DIAGNOSIS — S80211A Abrasion, right knee, initial encounter: Secondary | ICD-10-CM | POA: Diagnosis not present

## 2018-03-13 DIAGNOSIS — Z853 Personal history of malignant neoplasm of breast: Secondary | ICD-10-CM | POA: Diagnosis not present

## 2018-03-13 DIAGNOSIS — E079 Disorder of thyroid, unspecified: Secondary | ICD-10-CM | POA: Diagnosis not present

## 2018-03-18 ENCOUNTER — Emergency Department (HOSPITAL_BASED_OUTPATIENT_CLINIC_OR_DEPARTMENT_OTHER)
Admission: EM | Admit: 2018-03-18 | Discharge: 2018-03-18 | Disposition: A | Discharge status: Home / Self Care | Payer: Medicare Other | Attending: Emergency Medicine | Admitting: Emergency Medicine

## 2018-03-18 ENCOUNTER — Other Ambulatory Visit: Payer: Self-pay

## 2018-03-18 ENCOUNTER — Emergency Department (HOSPITAL_BASED_OUTPATIENT_CLINIC_OR_DEPARTMENT_OTHER): Payer: Medicare Other

## 2018-03-18 ENCOUNTER — Encounter (HOSPITAL_BASED_OUTPATIENT_CLINIC_OR_DEPARTMENT_OTHER): Payer: Self-pay | Admitting: Emergency Medicine

## 2018-03-18 DIAGNOSIS — Z853 Personal history of malignant neoplasm of breast: Secondary | ICD-10-CM | POA: Diagnosis not present

## 2018-03-18 DIAGNOSIS — Z79899 Other long term (current) drug therapy: Secondary | ICD-10-CM | POA: Insufficient documentation

## 2018-03-18 DIAGNOSIS — Z7982 Long term (current) use of aspirin: Secondary | ICD-10-CM | POA: Insufficient documentation

## 2018-03-18 DIAGNOSIS — M7989 Other specified soft tissue disorders: Secondary | ICD-10-CM

## 2018-03-18 DIAGNOSIS — I10 Essential (primary) hypertension: Secondary | ICD-10-CM | POA: Diagnosis not present

## 2018-03-18 DIAGNOSIS — R2241 Localized swelling, mass and lump, right lower limb: Secondary | ICD-10-CM | POA: Diagnosis not present

## 2018-03-18 DIAGNOSIS — R600 Localized edema: Secondary | ICD-10-CM | POA: Diagnosis not present

## 2018-03-18 MED ORDER — CLINDAMYCIN HCL 300 MG PO CAPS: 300.0000 mg | ORAL_CAPSULE | Freq: Four times a day (QID) | ORAL | 0 refills | Status: DC

## 2018-03-18 MED FILL — CLINDAMYCIN HCL 300 MG CAP: 300 | 7 days supply | Qty: 28 | Fill #0

## 2018-03-18 NOTE — ED Triage Notes (Signed)
Pt having right leg swelling for over one month.  Saw pcp and was placed on antibiotics around 02-28-18.  Pt went to another md last Thursday and was told to start using compression hose.  Pt continues to have right leg swelling.  No increased warmth noted.  Pt states she has redness, noted some possible darker areas but nothing severe.  No traveling until after swelling had started.  No chest pain, no sob.  Pt states leg is not painful.

## 2018-03-18 NOTE — ED Provider Notes (Signed)
[place] EMERGENCY DEPARTMENT Provider Note   CSN: [id number] Arrival date & time: 03/18/18  0730     History   Chief Complaint Chief Complaint  Patient presents with  . Leg Swelling    HPI Cheryl [name] is a 72 y.o. female.  HPI Patient presents with 3 to 4 weeks of right leg swelling.  Denies pain but did have some redness.  No history of trauma.  Was given prescription of antibiotics with no change in the swelling.  She is driving back and forth to [place] and had a recent flight to [place].  She denies any fever or chills.  She denies chest pain or shortness of breath.  No previous history of DVT/PE Past Medical History:  Diagnosis Date  . Blood transfusion   . Cancer [facility])    breast - left  . Hypertension   . Thyroid disease     There are no active problems to display for this patient.   Past Surgical History:  Procedure Laterality Date  . BREAST SURGERY  01/2000   left  . MANDIBLE SURGERY       OB History   None      Home Medications    Prior to Admission medications   Medication Sig Start Date End Date Taking? Authorizing Provider  aspirin 81 MG tablet Take 81 mg by mouth daily.      [provider]  Calcium Carbonate (CALCIUM 600 PO) Take by mouth.    [provider]  Cholecalciferol (VITAMIN D-3 PO) Take 5,000 Int'l Units by mouth.    [provider]  Cinnamon 500 MG capsule Take 1,000 mg by mouth daily.      [provider]  clindamycin (CLEOCIN) 300 MG capsule Take 1 capsule (300 mg total) by mouth 4 (four) times daily. X 7 days 03/18/18   Julianne Rice, MD  Flaxseed, Linseed, 1000 MG CAPS Take by mouth 1 dose over 46 hours.      [provider]  levothyroxine (SYNTHROID, LEVOTHROID) 75 MCG tablet Take 75 mcg by mouth daily. 12/29/17   [provider]  lisinopril-hydrochlorothiazide (PRINZIDE,ZESTORETIC) 10-12.5 MG tablet Take 1 tablet by mouth daily. 01/01/18   [provider]  UNABLE TO FIND L8000-Post-Surgical Bras-6  L8921- Silicone Breast Prosthesis-1 Dx: 174.9 Partial Mastectomy with left reduction 02/17/14   Alphonsa Overall, MD    Family History Family History  Problem Relation Age of Onset  . Heart disease Mother   . Heart disease Father   . Breast cancer Sister   . Cancer Sister        breast    Social History Social History   Tobacco Use  . Smoking status: Never Smoker  . Smokeless tobacco: Never Used  Substance Use Topics  . Alcohol use: Yes    Alcohol/week: 0.0 oz    Comment: wine - rare  . Drug use: No     Allergies   Patient has no known allergies.   Review of Systems Review of Systems  Constitutional: Negative for chills and fever.  Respiratory: Negative for cough and shortness of breath.   Cardiovascular: Positive for leg swelling. Negative for chest pain and palpitations.  Gastrointestinal: Negative for abdominal pain, constipation, diarrhea, nausea and vomiting.  Musculoskeletal: Negative for arthralgias, back pain, myalgias and neck pain.  Skin: Positive for color change. Negative for rash and wound.  Neurological: Negative for dizziness, weakness, light-headedness, numbness and headaches.  All other systems reviewed and  are negative.    Physical Exam Updated Vital Signs BP (!) 157/77   Pulse 98   Temp 99.7 F (37.6 C) (Oral)   Resp 16   Ht 5' (1.524 m)   Wt 56.7 kg (125 lb)   SpO2 100%   BMI 24.41 kg/m   Physical Exam  Constitutional: She is oriented to person, place, and time. She appears well-developed and well-nourished. No distress.  HENT:  Head: Normocephalic and atraumatic.  Mouth/Throat: Oropharynx is clear and moist.  Eyes: Pupils are equal, round, and reactive to light. EOM are normal.  Neck: Normal range of motion. Neck supple. No JVD present.  Cardiovascular: Normal rate and regular rhythm. Exam reveals no gallop and no friction rub.  No murmur heard. Pulmonary/Chest: Effort  normal and breath sounds normal. No stridor. No respiratory distress. She has no wheezes. She has no rales. She exhibits no tenderness.  Abdominal: Soft. Bowel sounds are normal. There is no tenderness. There is no rebound and no guarding.  Musculoskeletal: Normal range of motion. She exhibits edema. She exhibits no tenderness.  Right calf swelling compared to left.  Mild anterior erythema in the pretibial space.  No tenderness to palpation.  Full range of motion of the right knee and right ankle without pain.  No obvious effusion or deformity.  Distal pulses intact.  Neurological: She is alert and oriented to person, place, and time.  5/5 motor in all extremities.  Sensation fully intact.  Skin: Skin is warm and dry. Capillary refill takes less than 2 seconds. No rash noted. She is not diaphoretic. There is erythema.  Psychiatric: She has a normal mood and affect. Her behavior is normal.  Nursing note and vitals reviewed.    ED Treatments / Results  Labs (all labs ordered are listed, but only abnormal results are displayed) Labs Reviewed - No data to display  EKG None  Radiology US Venous Img Lower Unilateral Right  Result Date: 03/18/2018 CLINICAL DATA:  Right lower extremity edema for the past month. Several recent falls. History of breast cancer. Evaluate for DVT. EXAM: RIGHT LOWER EXTREMITY VENOUS DOPPLER ULTRASOUND TECHNIQUE: Gray-scale sonography with graded compression, as well as color Doppler and duplex ultrasound were performed to evaluate the lower extremity deep venous systems from the level of the common femoral vein and including the common femoral, femoral, profunda femoral, popliteal and calf veins including the posterior tibial, peroneal and gastrocnemius veins when visible. The superficial great saphenous vein was also interrogated. Spectral Doppler was utilized to evaluate flow at rest and with distal augmentation maneuvers in the common femoral, femoral and popliteal veins.  COMPARISON:  None. FINDINGS: Contralateral Common Femoral Vein: Respiratory phasicity is normal and symmetric with the symptomatic side. No evidence of thrombus. Normal compressibility. Common Femoral Vein: No evidence of thrombus. Normal compressibility, respiratory phasicity and response to augmentation. Saphenofemoral Junction: No evidence of thrombus. Normal compressibility and flow on color Doppler imaging. Profunda Femoral Vein: No evidence of thrombus. Normal compressibility and flow on color Doppler imaging. Femoral Vein: No evidence of thrombus. Normal compressibility, respiratory phasicity and response to augmentation. Popliteal Vein: No evidence of thrombus. Normal compressibility, respiratory phasicity and response to augmentation. Calf Veins: No evidence of thrombus. Normal compressibility and flow on color Doppler imaging. Superficial Great Saphenous Vein: No evidence of thrombus. Normal compressibility. Venous Reflux:  None. Other Findings:  None. IMPRESSION: No evidence of DVT within the right lower extremity. Electronically Signed   By: Sandi Mariscal M.D.   On: 03/18/2018  10:17    Procedures Procedures (including critical care time)  Medications Ordered in ED Medications - No data to display   Initial Impression / Assessment and Plan / ED Course  I have reviewed the triage vital signs and the nursing notes.  Pertinent labs & imaging results that were available during my care of the patient were reviewed by me and considered in my medical decision making (see chart for details).     No evidence of DVT on ultrasound.  Patient has mild elevation in temperature.  Leg appears atypical for cellulitis but she states she has had this in the past that has been treated with antibiotics and improved.  She completed a course of Augmentin.  Will expand coverage to MRSA.  She understands the need to follow-up closely with her primary physician and strict return precautions have been given.  Final  Clinical Impressions(s) / ED Diagnoses   Final diagnoses:  Swelling of right lower extremity    ED Discharge Orders        Ordered    clindamycin (CLEOCIN) 300 MG capsule  4 times daily     03/18/18 1115       Julianne Rice, MD 03/18/18 1117

## 2018-03-18 NOTE — Discharge Instructions (Addendum)
Keep leg elevated.  May continue to use compression stockings.  Follow-up with your primary physician to assure resolution of swelling.  If you have increased swelling, redness, fever, pain or any concerns return immediately to the emergency department.

## 2018-03-20 DIAGNOSIS — I10 Essential (primary) hypertension: Secondary | ICD-10-CM | POA: Diagnosis not present

## 2018-03-20 DIAGNOSIS — E039 Hypothyroidism, unspecified: Secondary | ICD-10-CM | POA: Diagnosis not present

## 2018-03-26 DIAGNOSIS — H01021 Squamous blepharitis right upper eyelid: Secondary | ICD-10-CM | POA: Diagnosis not present

## 2018-03-26 DIAGNOSIS — H01024 Squamous blepharitis left upper eyelid: Secondary | ICD-10-CM | POA: Diagnosis not present

## 2018-03-26 DIAGNOSIS — H35372 Puckering of macula, left eye: Secondary | ICD-10-CM | POA: Diagnosis not present

## 2018-03-26 DIAGNOSIS — H01022 Squamous blepharitis right lower eyelid: Secondary | ICD-10-CM | POA: Diagnosis not present

## 2018-03-26 DIAGNOSIS — H43812 Vitreous degeneration, left eye: Secondary | ICD-10-CM | POA: Diagnosis not present

## 2018-03-27 DIAGNOSIS — I8390 Asymptomatic varicose veins of unspecified lower extremity: Secondary | ICD-10-CM | POA: Diagnosis not present

## 2018-03-27 DIAGNOSIS — E559 Vitamin D deficiency, unspecified: Secondary | ICD-10-CM | POA: Diagnosis not present

## 2018-03-27 DIAGNOSIS — I10 Essential (primary) hypertension: Secondary | ICD-10-CM | POA: Diagnosis not present

## 2018-03-27 DIAGNOSIS — Z79899 Other long term (current) drug therapy: Secondary | ICD-10-CM | POA: Diagnosis not present

## 2018-03-27 DIAGNOSIS — E039 Hypothyroidism, unspecified: Secondary | ICD-10-CM | POA: Diagnosis not present

## 2018-03-28 ENCOUNTER — Other Ambulatory Visit: Payer: Self-pay

## 2018-03-28 DIAGNOSIS — I83891 Varicose veins of right lower extremities with other complications: Secondary | ICD-10-CM

## 2018-04-10 DIAGNOSIS — R600 Localized edema: Secondary | ICD-10-CM | POA: Diagnosis not present

## 2018-04-11 DIAGNOSIS — R600 Localized edema: Secondary | ICD-10-CM | POA: Diagnosis not present

## 2018-04-14 DIAGNOSIS — R600 Localized edema: Secondary | ICD-10-CM | POA: Diagnosis not present

## 2018-04-14 DIAGNOSIS — R2241 Localized swelling, mass and lump, right lower limb: Secondary | ICD-10-CM | POA: Diagnosis not present

## 2018-04-21 ENCOUNTER — Other Ambulatory Visit: Payer: Self-pay | Admitting: Family Medicine

## 2018-04-21 DIAGNOSIS — M7989 Other specified soft tissue disorders: Secondary | ICD-10-CM

## 2018-05-01 ENCOUNTER — Ambulatory Visit
Admission: RE | Admit: 2018-05-01 | Discharge: 2018-05-01 | Disposition: A | Discharge status: Home / Self Care | Payer: Medicare Other | Source: Ambulatory Visit | Attending: Family Medicine | Admitting: Family Medicine

## 2018-05-01 DIAGNOSIS — M7989 Other specified soft tissue disorders: Secondary | ICD-10-CM

## 2018-05-01 DIAGNOSIS — K5730 Diverticulosis of large intestine without perforation or abscess without bleeding: Secondary | ICD-10-CM | POA: Diagnosis not present

## 2018-05-01 MED ORDER — IOPAMIDOL (ISOVUE-300) INJECTION 61%
100.0000 mL | Freq: Once | INTRAVENOUS | Status: AC | PRN
  Administered 2018-05-01: 100 mL via INTRAVENOUS

## 2018-05-08 DIAGNOSIS — M5137 Other intervertebral disc degeneration, lumbosacral region: Secondary | ICD-10-CM | POA: Diagnosis not present

## 2018-05-08 DIAGNOSIS — M9903 Segmental and somatic dysfunction of lumbar region: Secondary | ICD-10-CM | POA: Diagnosis not present

## 2018-05-08 DIAGNOSIS — M9902 Segmental and somatic dysfunction of thoracic region: Secondary | ICD-10-CM | POA: Diagnosis not present

## 2018-05-08 DIAGNOSIS — M5417 Radiculopathy, lumbosacral region: Secondary | ICD-10-CM | POA: Diagnosis not present

## 2018-05-08 DIAGNOSIS — M9905 Segmental and somatic dysfunction of pelvic region: Secondary | ICD-10-CM | POA: Diagnosis not present

## 2018-05-12 DIAGNOSIS — M9902 Segmental and somatic dysfunction of thoracic region: Secondary | ICD-10-CM | POA: Diagnosis not present

## 2018-05-12 DIAGNOSIS — M5137 Other intervertebral disc degeneration, lumbosacral region: Secondary | ICD-10-CM | POA: Diagnosis not present

## 2018-05-12 DIAGNOSIS — M9905 Segmental and somatic dysfunction of pelvic region: Secondary | ICD-10-CM | POA: Diagnosis not present

## 2018-05-12 DIAGNOSIS — M9903 Segmental and somatic dysfunction of lumbar region: Secondary | ICD-10-CM | POA: Diagnosis not present

## 2018-05-12 DIAGNOSIS — M5417 Radiculopathy, lumbosacral region: Secondary | ICD-10-CM | POA: Diagnosis not present

## 2018-05-14 DIAGNOSIS — M9902 Segmental and somatic dysfunction of thoracic region: Secondary | ICD-10-CM | POA: Diagnosis not present

## 2018-05-14 DIAGNOSIS — M9905 Segmental and somatic dysfunction of pelvic region: Secondary | ICD-10-CM | POA: Diagnosis not present

## 2018-05-14 DIAGNOSIS — M9903 Segmental and somatic dysfunction of lumbar region: Secondary | ICD-10-CM | POA: Diagnosis not present

## 2018-05-14 DIAGNOSIS — M5137 Other intervertebral disc degeneration, lumbosacral region: Secondary | ICD-10-CM | POA: Diagnosis not present

## 2018-05-14 DIAGNOSIS — M5417 Radiculopathy, lumbosacral region: Secondary | ICD-10-CM | POA: Diagnosis not present

## 2018-05-16 DIAGNOSIS — M9903 Segmental and somatic dysfunction of lumbar region: Secondary | ICD-10-CM | POA: Diagnosis not present

## 2018-05-16 DIAGNOSIS — M5137 Other intervertebral disc degeneration, lumbosacral region: Secondary | ICD-10-CM | POA: Diagnosis not present

## 2018-05-16 DIAGNOSIS — M9905 Segmental and somatic dysfunction of pelvic region: Secondary | ICD-10-CM | POA: Diagnosis not present

## 2018-05-16 DIAGNOSIS — M5417 Radiculopathy, lumbosacral region: Secondary | ICD-10-CM | POA: Diagnosis not present

## 2018-05-16 DIAGNOSIS — M9902 Segmental and somatic dysfunction of thoracic region: Secondary | ICD-10-CM | POA: Diagnosis not present

## 2018-05-19 DIAGNOSIS — M5137 Other intervertebral disc degeneration, lumbosacral region: Secondary | ICD-10-CM | POA: Diagnosis not present

## 2018-05-19 DIAGNOSIS — M5417 Radiculopathy, lumbosacral region: Secondary | ICD-10-CM | POA: Diagnosis not present

## 2018-05-19 DIAGNOSIS — M9902 Segmental and somatic dysfunction of thoracic region: Secondary | ICD-10-CM | POA: Diagnosis not present

## 2018-05-19 DIAGNOSIS — M9903 Segmental and somatic dysfunction of lumbar region: Secondary | ICD-10-CM | POA: Diagnosis not present

## 2018-05-19 DIAGNOSIS — M9905 Segmental and somatic dysfunction of pelvic region: Secondary | ICD-10-CM | POA: Diagnosis not present

## 2018-05-21 DIAGNOSIS — M5417 Radiculopathy, lumbosacral region: Secondary | ICD-10-CM | POA: Diagnosis not present

## 2018-05-21 DIAGNOSIS — M9905 Segmental and somatic dysfunction of pelvic region: Secondary | ICD-10-CM | POA: Diagnosis not present

## 2018-05-21 DIAGNOSIS — M9903 Segmental and somatic dysfunction of lumbar region: Secondary | ICD-10-CM | POA: Diagnosis not present

## 2018-05-21 DIAGNOSIS — M9902 Segmental and somatic dysfunction of thoracic region: Secondary | ICD-10-CM | POA: Diagnosis not present

## 2018-05-21 DIAGNOSIS — M5137 Other intervertebral disc degeneration, lumbosacral region: Secondary | ICD-10-CM | POA: Diagnosis not present

## 2018-05-23 DIAGNOSIS — M9902 Segmental and somatic dysfunction of thoracic region: Secondary | ICD-10-CM | POA: Diagnosis not present

## 2018-05-23 DIAGNOSIS — M5137 Other intervertebral disc degeneration, lumbosacral region: Secondary | ICD-10-CM | POA: Diagnosis not present

## 2018-05-23 DIAGNOSIS — M9905 Segmental and somatic dysfunction of pelvic region: Secondary | ICD-10-CM | POA: Diagnosis not present

## 2018-05-23 DIAGNOSIS — M9903 Segmental and somatic dysfunction of lumbar region: Secondary | ICD-10-CM | POA: Diagnosis not present

## 2018-05-23 DIAGNOSIS — M5417 Radiculopathy, lumbosacral region: Secondary | ICD-10-CM | POA: Diagnosis not present

## 2018-05-26 DIAGNOSIS — M5417 Radiculopathy, lumbosacral region: Secondary | ICD-10-CM | POA: Diagnosis not present

## 2018-05-26 DIAGNOSIS — M9903 Segmental and somatic dysfunction of lumbar region: Secondary | ICD-10-CM | POA: Diagnosis not present

## 2018-05-26 DIAGNOSIS — M9905 Segmental and somatic dysfunction of pelvic region: Secondary | ICD-10-CM | POA: Diagnosis not present

## 2018-05-26 DIAGNOSIS — M5137 Other intervertebral disc degeneration, lumbosacral region: Secondary | ICD-10-CM | POA: Diagnosis not present

## 2018-05-26 DIAGNOSIS — M9902 Segmental and somatic dysfunction of thoracic region: Secondary | ICD-10-CM | POA: Diagnosis not present

## 2018-05-28 DIAGNOSIS — M9903 Segmental and somatic dysfunction of lumbar region: Secondary | ICD-10-CM | POA: Diagnosis not present

## 2018-05-28 DIAGNOSIS — M9902 Segmental and somatic dysfunction of thoracic region: Secondary | ICD-10-CM | POA: Diagnosis not present

## 2018-05-28 DIAGNOSIS — M9905 Segmental and somatic dysfunction of pelvic region: Secondary | ICD-10-CM | POA: Diagnosis not present

## 2018-05-28 DIAGNOSIS — M5137 Other intervertebral disc degeneration, lumbosacral region: Secondary | ICD-10-CM | POA: Diagnosis not present

## 2018-05-28 DIAGNOSIS — M5417 Radiculopathy, lumbosacral region: Secondary | ICD-10-CM | POA: Diagnosis not present

## 2018-05-30 DIAGNOSIS — M5417 Radiculopathy, lumbosacral region: Secondary | ICD-10-CM | POA: Diagnosis not present

## 2018-05-30 DIAGNOSIS — M9905 Segmental and somatic dysfunction of pelvic region: Secondary | ICD-10-CM | POA: Diagnosis not present

## 2018-05-30 DIAGNOSIS — M5137 Other intervertebral disc degeneration, lumbosacral region: Secondary | ICD-10-CM | POA: Diagnosis not present

## 2018-05-30 DIAGNOSIS — M9903 Segmental and somatic dysfunction of lumbar region: Secondary | ICD-10-CM | POA: Diagnosis not present

## 2018-05-30 DIAGNOSIS — M9902 Segmental and somatic dysfunction of thoracic region: Secondary | ICD-10-CM | POA: Diagnosis not present

## 2018-06-02 DIAGNOSIS — M5417 Radiculopathy, lumbosacral region: Secondary | ICD-10-CM | POA: Diagnosis not present

## 2018-06-02 DIAGNOSIS — M9902 Segmental and somatic dysfunction of thoracic region: Secondary | ICD-10-CM | POA: Diagnosis not present

## 2018-06-02 DIAGNOSIS — M9903 Segmental and somatic dysfunction of lumbar region: Secondary | ICD-10-CM | POA: Diagnosis not present

## 2018-06-02 DIAGNOSIS — M9905 Segmental and somatic dysfunction of pelvic region: Secondary | ICD-10-CM | POA: Diagnosis not present

## 2018-06-02 DIAGNOSIS — M5137 Other intervertebral disc degeneration, lumbosacral region: Secondary | ICD-10-CM | POA: Diagnosis not present

## 2018-06-04 DIAGNOSIS — M9902 Segmental and somatic dysfunction of thoracic region: Secondary | ICD-10-CM | POA: Diagnosis not present

## 2018-06-04 DIAGNOSIS — M9905 Segmental and somatic dysfunction of pelvic region: Secondary | ICD-10-CM | POA: Diagnosis not present

## 2018-06-04 DIAGNOSIS — M5137 Other intervertebral disc degeneration, lumbosacral region: Secondary | ICD-10-CM | POA: Diagnosis not present

## 2018-06-04 DIAGNOSIS — M9903 Segmental and somatic dysfunction of lumbar region: Secondary | ICD-10-CM | POA: Diagnosis not present

## 2018-06-04 DIAGNOSIS — M5417 Radiculopathy, lumbosacral region: Secondary | ICD-10-CM | POA: Diagnosis not present

## 2018-06-06 ENCOUNTER — Encounter: Payer: Medicare Other | Admitting: Vascular Surgery

## 2018-06-06 ENCOUNTER — Encounter (HOSPITAL_COMMUNITY): Payer: Medicare Other

## 2018-06-13 DIAGNOSIS — R600 Localized edema: Secondary | ICD-10-CM | POA: Diagnosis not present

## 2018-06-19 DIAGNOSIS — R600 Localized edema: Secondary | ICD-10-CM | POA: Diagnosis not present

## 2018-06-20 DIAGNOSIS — R600 Localized edema: Secondary | ICD-10-CM | POA: Diagnosis not present

## 2018-06-20 DIAGNOSIS — I10 Essential (primary) hypertension: Secondary | ICD-10-CM | POA: Diagnosis not present

## 2018-06-20 DIAGNOSIS — Z79899 Other long term (current) drug therapy: Secondary | ICD-10-CM | POA: Diagnosis not present

## 2018-06-24 DIAGNOSIS — Z23 Encounter for immunization: Secondary | ICD-10-CM | POA: Diagnosis not present

## 2018-06-24 DIAGNOSIS — R600 Localized edema: Secondary | ICD-10-CM | POA: Diagnosis not present

## 2018-06-26 ENCOUNTER — Other Ambulatory Visit: Payer: Self-pay

## 2018-06-26 DIAGNOSIS — R609 Edema, unspecified: Secondary | ICD-10-CM

## 2018-06-30 DIAGNOSIS — R0602 Shortness of breath: Secondary | ICD-10-CM | POA: Diagnosis not present

## 2018-06-30 DIAGNOSIS — M7989 Other specified soft tissue disorders: Secondary | ICD-10-CM | POA: Diagnosis not present

## 2018-06-30 DIAGNOSIS — E7800 Pure hypercholesterolemia, unspecified: Secondary | ICD-10-CM | POA: Diagnosis not present

## 2018-06-30 DIAGNOSIS — R5383 Other fatigue: Secondary | ICD-10-CM | POA: Diagnosis not present

## 2018-06-30 DIAGNOSIS — M1380 Other specified arthritis, unspecified site: Secondary | ICD-10-CM | POA: Diagnosis not present

## 2018-06-30 DIAGNOSIS — I10 Essential (primary) hypertension: Secondary | ICD-10-CM | POA: Diagnosis not present

## 2018-06-30 DIAGNOSIS — M129 Arthropathy, unspecified: Secondary | ICD-10-CM | POA: Diagnosis not present

## 2018-07-07 DIAGNOSIS — R600 Localized edema: Secondary | ICD-10-CM | POA: Diagnosis not present

## 2018-07-07 DIAGNOSIS — M79606 Pain in leg, unspecified: Secondary | ICD-10-CM | POA: Diagnosis not present

## 2018-07-08 DIAGNOSIS — I10 Essential (primary) hypertension: Secondary | ICD-10-CM | POA: Diagnosis not present

## 2018-07-08 DIAGNOSIS — M7989 Other specified soft tissue disorders: Secondary | ICD-10-CM | POA: Diagnosis not present

## 2018-07-08 DIAGNOSIS — Z712 Person consulting for explanation of examination or test findings: Secondary | ICD-10-CM | POA: Diagnosis not present

## 2018-09-01 ENCOUNTER — Ambulatory Visit: Payer: Medicare Other | Admitting: Surgery

## 2018-09-01 ENCOUNTER — Other Ambulatory Visit: Payer: Self-pay

## 2018-09-01 ENCOUNTER — Ambulatory Visit (HOSPITAL_COMMUNITY)
Admission: RE | Admit: 2018-09-01 | Discharge: 2018-09-01 | Disposition: A | Discharge status: Home / Self Care | Payer: Medicare Other | Source: Ambulatory Visit | Attending: Internal Medicine | Admitting: Internal Medicine

## 2018-09-01 ENCOUNTER — Encounter: Payer: Self-pay | Admitting: Surgery

## 2018-09-01 VITALS — BP 106/71 | HR 93 | Temp 97.9°F | Resp 16 | Ht 60.0 in | Wt 127.3 lb

## 2018-09-01 DIAGNOSIS — R609 Edema, unspecified: Secondary | ICD-10-CM

## 2018-09-01 NOTE — Progress Notes (Signed)
[facility]ist [facility]  Patient name: Cheryl [name] MRN: [id number] DOB: 1946/04/15 Sex: female   REQUESTING PROVIDER:    Dr. Maudie Mercury   REASON FOR CONSULT:    Leg swelling  HISTORY OF PRESENT ILLNESS:   Cheryl [name] is a 72 y.o. female, who is referred today for evaluation of right leg swelling.  This is been a chronic issue for the patient dating back to childhood.  She has had several bouts of cellulitis which have improved with doxycycline.  She has had a prior ultrasound which was negative for DVT.  She does wear 20-30 compression stockings and has been working with a lymphedema therapist.  Patient is medically managed for hypertension.  This is treated with an ACE inhibitor.  She is status post lumpectomy with chemotherapy and radiation for left breast cancer.  She is a former smoker.  PAST MEDICAL HISTORY    Past Medical History:  Diagnosis Date  . Blood transfusion   . Cancer [facility])    breast - left  . Hypertension   . Thyroid disease      FAMILY HISTORY   Family History  Problem Relation Age of Onset  . Heart disease Mother   . Heart disease Father   . Breast cancer Sister   . Cancer Sister        breast    SOCIAL HISTORY:   Social History   Socioeconomic History  . Marital status: Married    Spouse name: Not on file  . Number of children: Not on file  . Years of education: Not on file  . Highest education level: Not on file  Occupational History  . Not on file  Social Needs  . Financial resource strain: Not on file  . Food insecurity:    Worry: Not on file    Inability: Not on file  . Transportation needs:    Medical: Not on file    Non-medical: Not on file  Tobacco Use  . Smoking status: Former Smoker    Last attempt to quit: 2001    Years since quitting: 18.8  . Smokeless tobacco: Never Used  Substance and Sexual Activity  . Alcohol use: Yes    Alcohol/week: 0.0 standard drinks   Comment: wine - rare  . Drug use: No  . Sexual activity: Not on file  Lifestyle  . Physical activity:    Days per week: Not on file    Minutes per session: Not on file  . Stress: Not on file  Relationships  . Social connections:    Talks on phone: Not on file    Gets together: Not on file    Attends religious service: Not on file    Active member of club or organization: Not on file    Attends meetings of clubs or organizations: Not on file    Relationship status: Not on file  . Intimate partner violence:    Fear of current or ex partner: Not on file    Emotionally abused: Not on file    Physically abused: Not on file    Forced sexual activity: Not on file  Other Topics Concern  . Not on file  Social History Narrative  . Not on file    ALLERGIES:    No Known Allergies  CURRENT MEDICATIONS:    Current Outpatient Medications  Medication Sig Dispense Refill  . aspirin 81 MG tablet Take 81 mg by mouth daily.      Marland Kitchen  Calcium Carbonate (CALCIUM 600 PO) Take by mouth.    . Cholecalciferol (VITAMIN D-3 PO) Take 5,000 Int'l Units by mouth.    . Cinnamon 500 MG capsule Take 1,000 mg by mouth daily.      . Flaxseed, Linseed, 1000 MG CAPS Take by mouth 1 dose over 46 hours.      Marland Kitchen levothyroxine (SYNTHROID, LEVOTHROID) 75 MCG tablet Take 75 mcg by mouth daily.  4  . lisinopril-hydrochlorothiazide (PRINZIDE,ZESTORETIC) 10-12.5 MG tablet Take 1 tablet by mouth daily.  11  . UNABLE TO FIND L8000-Post-Surgical Bras-6  X3244- Silicone Breast Prosthesis-1 Dx: 174.9 Partial Mastectomy with left reduction 1 each 0   No current facility-administered medications for this visit.     REVIEW OF SYSTEMS:   [X]  denotes positive finding, [ ]  denotes negative finding Cardiac  Comments:  Chest pain or chest pressure:    Shortness of breath upon exertion:    Short of breath when lying flat:    Irregular heart rhythm:        Vascular    Pain in calf, thigh, or hip brought on by ambulation:     Pain in feet at night that wakes you up from your sleep:     Blood clot in your veins:    Leg swelling:  x       Pulmonary    Oxygen at home:    Productive cough:     Wheezing:         Neurologic    Sudden weakness in arms or legs:     Sudden numbness in arms or legs:     Sudden onset of difficulty speaking or slurred speech:    Temporary loss of vision in one eye:     Problems with dizziness:         Gastrointestinal    Blood in stool:      Vomited blood:         Genitourinary    Burning when urinating:     Blood in urine:        Psychiatric    Major depression:         Hematologic    Bleeding problems:    Problems with blood clotting too easily:        Skin    Rashes or ulcers:        Constitutional    Fever or chills:     PHYSICAL EXAM:   Vitals:   09/01/18 1227  BP: 106/71  Pulse: 93  Resp: 16  Temp: 97.9 F (36.6 C)  TempSrc: Oral  SpO2: 97%  Weight: 127 lb 4.8 oz (57.7 kg)  Height: 5' (1.524 m)    GENERAL: The patient is a well-nourished female, in no acute distress. The vital signs are documented above. CARDIAC: There is a regular rate and rhythm.  VASCULAR: Brisk right posterior tibial Doppler signal and biphasic dorsalis pedis Doppler signal.  2+ edema to the right leg. PULMONARY: Nonlabored respirations MUSCULOSKELETAL: There are no major deformities or cyanosis. NEUROLOGIC: No focal weakness or paresthesias are detected. SKIN: There are no ulcers or rashes noted. PSYCHIATRIC: The patient has a normal affect.  STUDIES:   I have ordered and reviewed her ultrasound.  There was no evidence of DVT.  She had reflux in the common femoral vein and saphenofemoral junction  ASSESSMENT and PLAN   Right leg edema: This most likely represents lymphedema.  There is a mild venous insufficiency component within the deep venous system.  Most appropriate management is going to be leg elevation with compression stockings.  I recommended she increase her  compression grade 2 40-50.  I have also encouraged her to continue seeing her lymphedema therapist.  We also discussed the possibility of a lymphedema pump.  She understands that all these efforts are in order to lower her risk of ulceration and recurrent cellulitis.  She will follow-up with me on an as-needed basis.   Annamarie Major, MD [facility] [phone] Pager [phone]

## 2018-09-15 DIAGNOSIS — Z853 Personal history of malignant neoplasm of breast: Secondary | ICD-10-CM | POA: Diagnosis not present

## 2018-09-15 DIAGNOSIS — Z1231 Encounter for screening mammogram for malignant neoplasm of breast: Secondary | ICD-10-CM | POA: Diagnosis not present

## 2018-09-22 DIAGNOSIS — I10 Essential (primary) hypertension: Secondary | ICD-10-CM | POA: Diagnosis not present

## 2018-09-22 DIAGNOSIS — E559 Vitamin D deficiency, unspecified: Secondary | ICD-10-CM | POA: Diagnosis not present

## 2018-09-22 DIAGNOSIS — E039 Hypothyroidism, unspecified: Secondary | ICD-10-CM | POA: Diagnosis not present

## 2018-09-29 DIAGNOSIS — Z23 Encounter for immunization: Secondary | ICD-10-CM | POA: Diagnosis not present

## 2018-09-29 DIAGNOSIS — I10 Essential (primary) hypertension: Secondary | ICD-10-CM | POA: Diagnosis not present

## 2018-09-29 DIAGNOSIS — E039 Hypothyroidism, unspecified: Secondary | ICD-10-CM | POA: Diagnosis not present

## 2018-09-29 DIAGNOSIS — Z0000 Encounter for general adult medical examination without abnormal findings: Secondary | ICD-10-CM | POA: Diagnosis not present

## 2018-09-29 DIAGNOSIS — I890 Lymphedema, not elsewhere classified: Secondary | ICD-10-CM | POA: Diagnosis not present

## 2018-12-03 DIAGNOSIS — N61 Mastitis without abscess: Secondary | ICD-10-CM | POA: Diagnosis not present

## 2018-12-03 DIAGNOSIS — R222 Localized swelling, mass and lump, trunk: Secondary | ICD-10-CM | POA: Diagnosis not present

## 2018-12-10 DIAGNOSIS — N61 Mastitis without abscess: Secondary | ICD-10-CM | POA: Diagnosis not present

## 2018-12-10 DIAGNOSIS — R222 Localized swelling, mass and lump, trunk: Secondary | ICD-10-CM | POA: Diagnosis not present

## 2019-02-01 IMAGING — US US EXTREM LOW VENOUS*R*
1 series · 13 of 24 positions shown · non-contrast
Comparison: None.

CLINICAL DATA: Right lower extremity edema for the past month.
Several recent falls. History of breast cancer. Evaluate for DVT.



[Series 1: us extrem low venous*right* · 0.08mm/px · 13 of 34 slices shown]
[im 1/34]
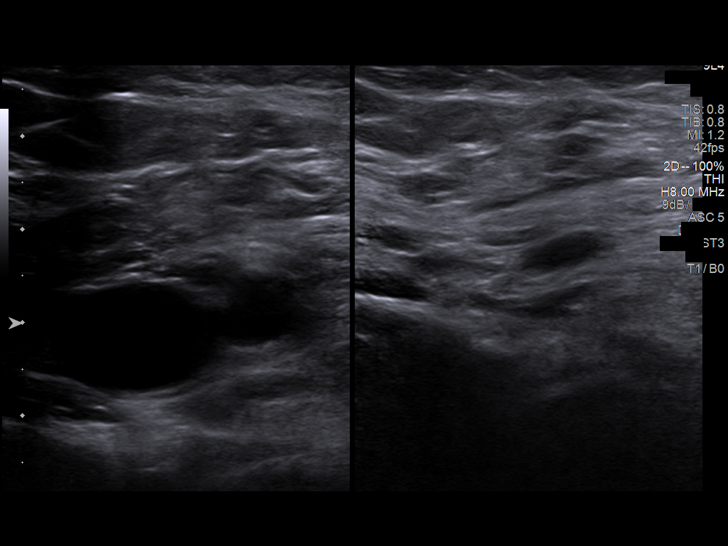
[im 3/34]
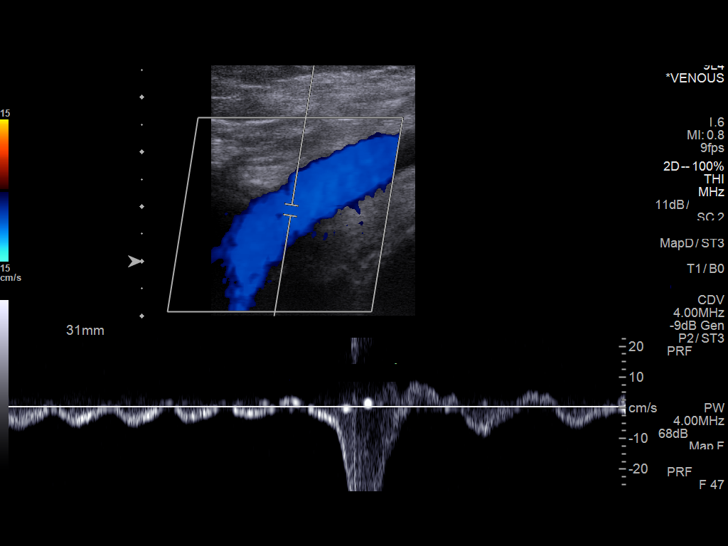
[im 6/34]
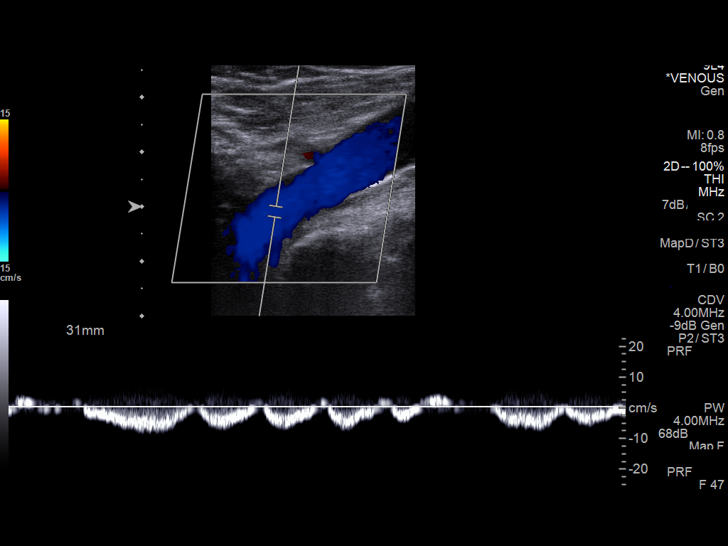
[im 9/34]
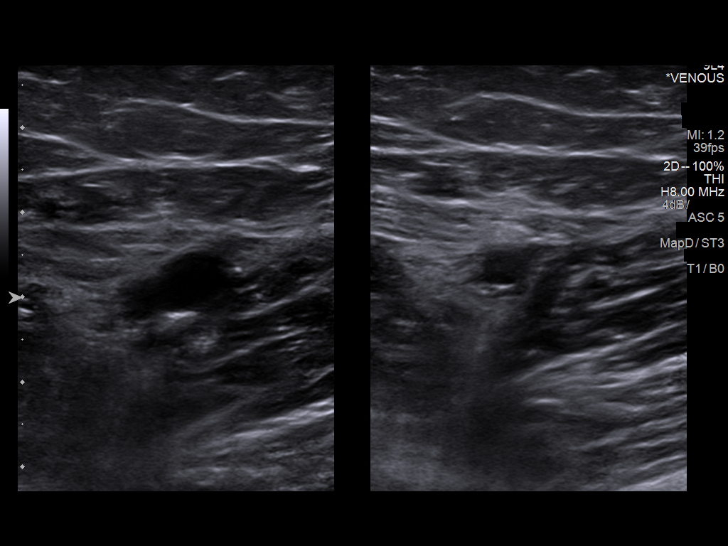
[im 12/34]
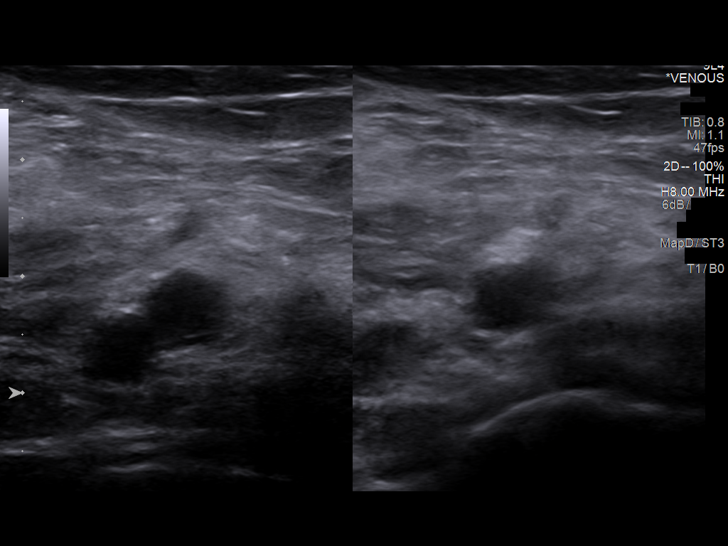
[im 15/34]
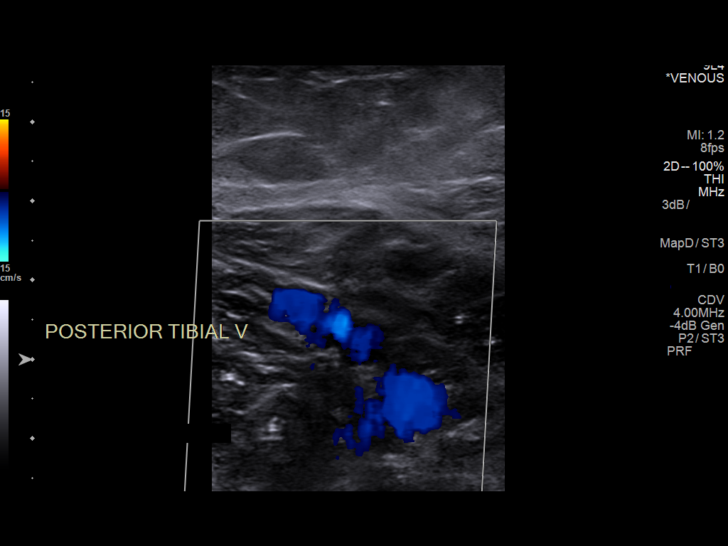
[im 18/34]
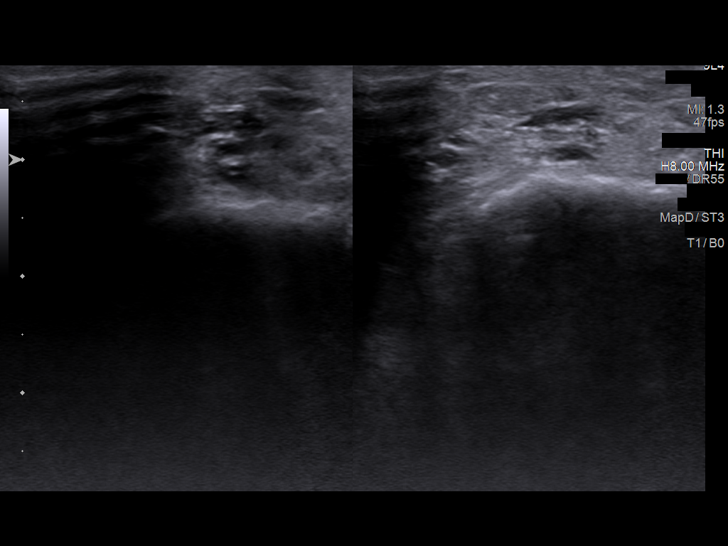
[im 19/34]
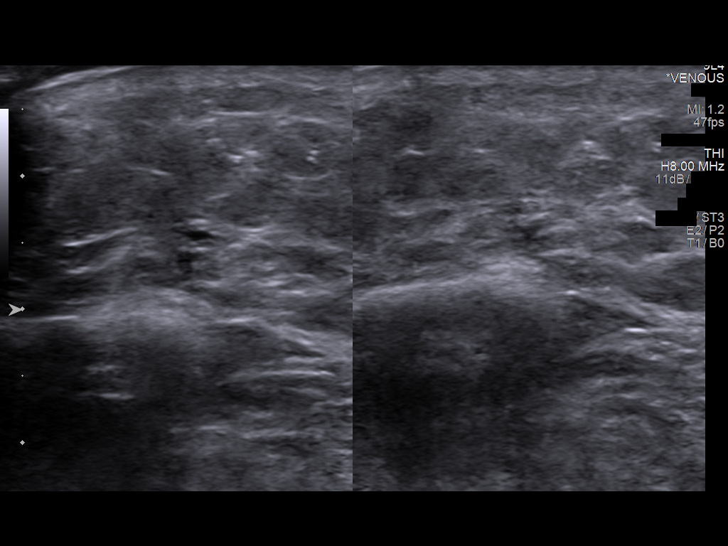
[im 22/34]
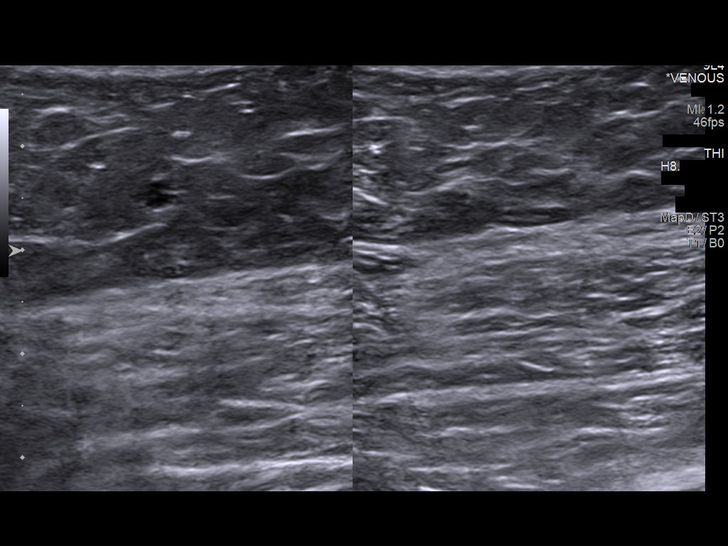
[im 25/34]
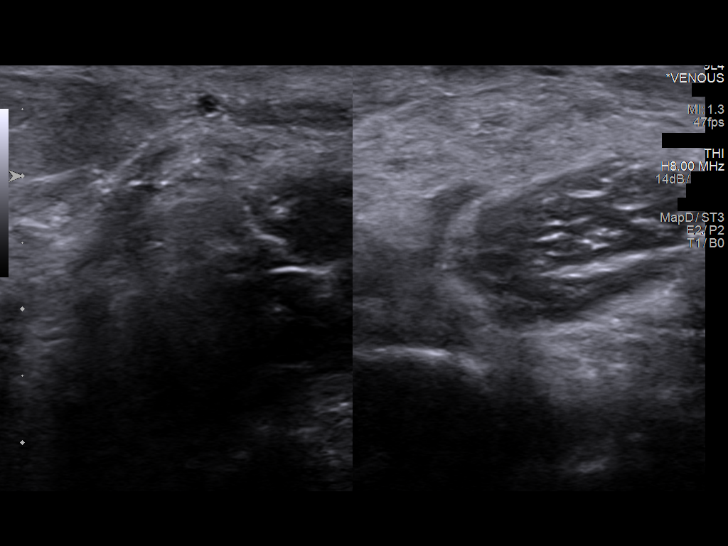
[im 28/34]
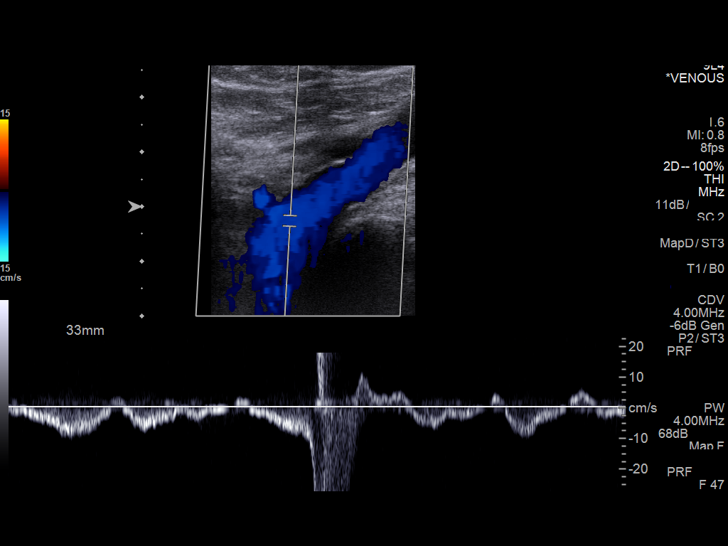
[im 31/34]
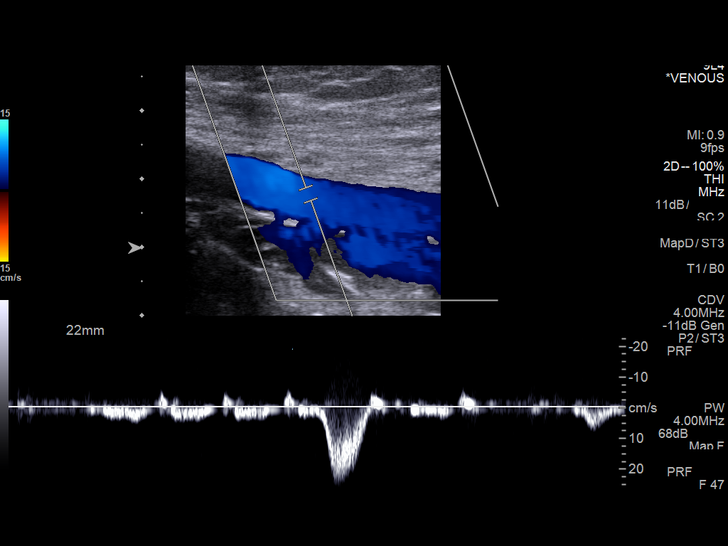
[im 34/34]
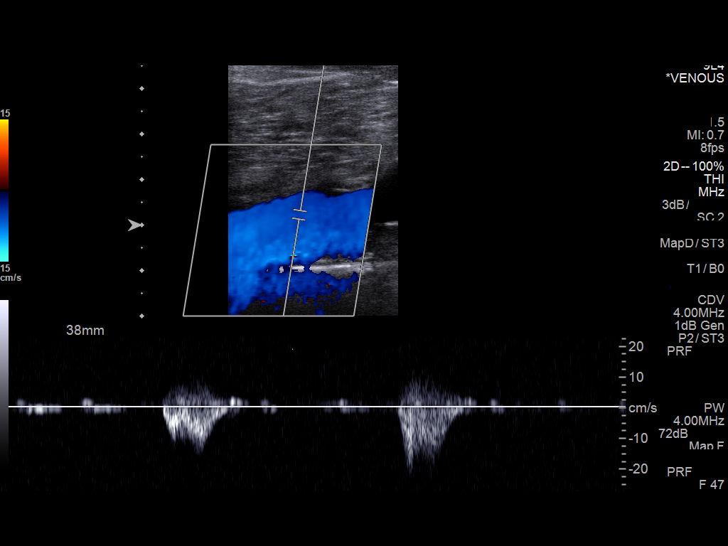

[13 of 24 positions shown; findings below may reference images not displayed]

FINDINGS: Contralateral Common Femoral Vein: Respiratory phasicity is normal
and symmetric with the symptomatic side. No evidence of thrombus.
Normal compressibility.

Common Femoral Vein: No evidence of thrombus. Normal
compressibility, respiratory phasicity and response to augmentation.

Saphenofemoral Junction: No evidence of thrombus. Normal
compressibility and flow on color Doppler imaging.

Profunda Femoral Vein: No evidence of thrombus. Normal
compressibility and flow on color Doppler imaging.

Femoral Vein: No evidence of thrombus. Normal compressibility,
respiratory phasicity and response to augmentation.

Popliteal Vein: No evidence of thrombus. Normal compressibility,
respiratory phasicity and response to augmentation.

Calf Veins: No evidence of thrombus. Normal compressibility and flow
on color Doppler imaging.

Superficial Great Saphenous Vein: No evidence of thrombus. Normal
compressibility.

Venous Reflux:  None.

Other Findings:  None.
IMPRESSION: No evidence of DVT within the right lower extremity.

## 2019-03-19 DIAGNOSIS — I10 Essential (primary) hypertension: Secondary | ICD-10-CM | POA: Diagnosis not present

## 2019-03-19 DIAGNOSIS — E559 Vitamin D deficiency, unspecified: Secondary | ICD-10-CM | POA: Diagnosis not present

## 2019-03-19 DIAGNOSIS — E039 Hypothyroidism, unspecified: Secondary | ICD-10-CM | POA: Diagnosis not present

## 2019-03-19 DIAGNOSIS — Z0000 Encounter for general adult medical examination without abnormal findings: Secondary | ICD-10-CM | POA: Diagnosis not present

## 2019-03-26 DIAGNOSIS — Z853 Personal history of malignant neoplasm of breast: Secondary | ICD-10-CM | POA: Diagnosis not present

## 2019-03-26 DIAGNOSIS — I10 Essential (primary) hypertension: Secondary | ICD-10-CM | POA: Diagnosis not present

## 2019-03-26 DIAGNOSIS — L0390 Cellulitis, unspecified: Secondary | ICD-10-CM | POA: Diagnosis not present

## 2019-03-26 DIAGNOSIS — I890 Lymphedema, not elsewhere classified: Secondary | ICD-10-CM | POA: Diagnosis not present

## 2019-03-26 DIAGNOSIS — E039 Hypothyroidism, unspecified: Secondary | ICD-10-CM | POA: Diagnosis not present

## 2019-06-23 DIAGNOSIS — Z23 Encounter for immunization: Secondary | ICD-10-CM | POA: Diagnosis not present

## 2019-09-17 DIAGNOSIS — E559 Vitamin D deficiency, unspecified: Secondary | ICD-10-CM | POA: Diagnosis not present

## 2019-09-17 DIAGNOSIS — E039 Hypothyroidism, unspecified: Secondary | ICD-10-CM | POA: Diagnosis not present

## 2019-09-17 DIAGNOSIS — Z0000 Encounter for general adult medical examination without abnormal findings: Secondary | ICD-10-CM | POA: Diagnosis not present

## 2019-09-21 DIAGNOSIS — Z1231 Encounter for screening mammogram for malignant neoplasm of breast: Secondary | ICD-10-CM | POA: Diagnosis not present

## 2019-09-21 DIAGNOSIS — Z853 Personal history of malignant neoplasm of breast: Secondary | ICD-10-CM | POA: Diagnosis not present

## 2019-09-22 DIAGNOSIS — L821 Other seborrheic keratosis: Secondary | ICD-10-CM | POA: Diagnosis not present

## 2019-09-22 DIAGNOSIS — D485 Neoplasm of uncertain behavior of skin: Secondary | ICD-10-CM | POA: Diagnosis not present

## 2019-09-22 DIAGNOSIS — L814 Other melanin hyperpigmentation: Secondary | ICD-10-CM | POA: Diagnosis not present

## 2019-09-22 DIAGNOSIS — D1801 Hemangioma of skin and subcutaneous tissue: Secondary | ICD-10-CM | POA: Diagnosis not present

## 2019-09-22 DIAGNOSIS — C44319 Basal cell carcinoma of skin of other parts of face: Secondary | ICD-10-CM | POA: Diagnosis not present

## 2019-09-22 DIAGNOSIS — D225 Melanocytic nevi of trunk: Secondary | ICD-10-CM | POA: Diagnosis not present

## 2019-09-24 DIAGNOSIS — Z7189 Other specified counseling: Secondary | ICD-10-CM | POA: Diagnosis not present

## 2019-09-24 DIAGNOSIS — Z853 Personal history of malignant neoplasm of breast: Secondary | ICD-10-CM | POA: Diagnosis not present

## 2019-09-24 DIAGNOSIS — E039 Hypothyroidism, unspecified: Secondary | ICD-10-CM | POA: Diagnosis not present

## 2019-09-24 DIAGNOSIS — I10 Essential (primary) hypertension: Secondary | ICD-10-CM | POA: Diagnosis not present

## 2019-09-24 DIAGNOSIS — M8580 Other specified disorders of bone density and structure, unspecified site: Secondary | ICD-10-CM | POA: Diagnosis not present

## 2019-09-24 DIAGNOSIS — M8589 Other specified disorders of bone density and structure, multiple sites: Secondary | ICD-10-CM | POA: Diagnosis not present

## 2019-10-25 ENCOUNTER — Ambulatory Visit: Payer: Medicare Other | Attending: Internal Medicine

## 2019-10-25 DIAGNOSIS — Z23 Encounter for immunization: Secondary | ICD-10-CM

## 2019-10-25 NOTE — Progress Notes (Signed)
   Covid-19 Vaccination Clinic  Name:  Cheryl [name]    MRN: [id number] DOB: [birth year]  10/25/2019  Cheryl [name] was observed post Covid-19 immunization for 15 minutes without incidence. She was provided with Vaccine Information Sheet and instruction to access the V-Safe system.   Cheryl [name] was instructed to call 911 with any severe reactions post vaccine: Marland Kitchen Difficulty breathing  . Swelling of your face and throat  . A fast heartbeat  . A bad rash all over your body  . Dizziness and weakness    Immunizations Administered    Name Date Dose VIS Date Route   Pfizer COVID-19 Vaccine 10/25/2019 12:09 PM 0.3 mL 09/25/2019 Intramuscular   Manufacturer: [place]   Lot: [id number]   [place]: [id number]

## 2019-11-12 ENCOUNTER — Ambulatory Visit: Payer: Medicare Other

## 2019-11-14 ENCOUNTER — Ambulatory Visit: Payer: Medicare Other | Attending: Internal Medicine

## 2019-11-14 DIAGNOSIS — Z23 Encounter for immunization: Secondary | ICD-10-CM | POA: Insufficient documentation

## 2019-11-14 NOTE — Progress Notes (Signed)
   Covid-19 Vaccination Clinic  Name:  Cheryl [name]    MRN: [id number] DOB: 1946-01-29  11/14/2019  Cheryl [name] was observed post Covid-19 immunization for 15 minutes without incidence. She was provided with Vaccine Information Sheet and instruction to access the V-Safe system.   Cheryl [name] was instructed to call 911 with any severe reactions post vaccine: Marland Kitchen Difficulty breathing  . Swelling of your face and throat  . A fast heartbeat  . A bad rash all over your body  . Dizziness and weakness    Immunizations Administered    Name Date Dose VIS Date Route   Pfizer COVID-19 Vaccine 11/14/2019  8:45 AM 0.3 mL 09/25/2019 Intramuscular   Manufacturer: [place]   Lot: [id number]   [place]: [id number]

## 2019-11-25 DIAGNOSIS — C44311 Basal cell carcinoma of skin of nose: Secondary | ICD-10-CM | POA: Diagnosis not present

## 2019-12-17 DIAGNOSIS — C50912 Malignant neoplasm of unspecified site of left female breast: Secondary | ICD-10-CM | POA: Diagnosis not present

## 2020-01-14 DIAGNOSIS — K649 Unspecified hemorrhoids: Secondary | ICD-10-CM | POA: Diagnosis not present

## 2020-01-14 DIAGNOSIS — I739 Peripheral vascular disease, unspecified: Secondary | ICD-10-CM | POA: Diagnosis not present

## 2020-01-14 DIAGNOSIS — E559 Vitamin D deficiency, unspecified: Secondary | ICD-10-CM | POA: Diagnosis not present

## 2020-01-14 DIAGNOSIS — E039 Hypothyroidism, unspecified: Secondary | ICD-10-CM | POA: Diagnosis not present

## 2020-01-14 DIAGNOSIS — Z0000 Encounter for general adult medical examination without abnormal findings: Secondary | ICD-10-CM | POA: Diagnosis not present

## 2020-03-03 DIAGNOSIS — E559 Vitamin D deficiency, unspecified: Secondary | ICD-10-CM | POA: Diagnosis not present

## 2020-03-03 DIAGNOSIS — R739 Hyperglycemia, unspecified: Secondary | ICD-10-CM | POA: Diagnosis not present

## 2020-03-03 DIAGNOSIS — Z0000 Encounter for general adult medical examination without abnormal findings: Secondary | ICD-10-CM | POA: Diagnosis not present

## 2020-03-03 DIAGNOSIS — Z0183 Encounter for blood typing: Secondary | ICD-10-CM | POA: Diagnosis not present

## 2020-03-03 DIAGNOSIS — E039 Hypothyroidism, unspecified: Secondary | ICD-10-CM | POA: Diagnosis not present

## 2020-03-08 DIAGNOSIS — Z961 Presence of intraocular lens: Secondary | ICD-10-CM | POA: Diagnosis not present

## 2020-03-08 DIAGNOSIS — H02831 Dermatochalasis of right upper eyelid: Secondary | ICD-10-CM | POA: Diagnosis not present

## 2020-03-08 DIAGNOSIS — H35372 Puckering of macula, left eye: Secondary | ICD-10-CM | POA: Diagnosis not present

## 2020-03-08 DIAGNOSIS — H43822 Vitreomacular adhesion, left eye: Secondary | ICD-10-CM | POA: Diagnosis not present

## 2020-03-08 DIAGNOSIS — H02834 Dermatochalasis of left upper eyelid: Secondary | ICD-10-CM | POA: Diagnosis not present

## 2020-03-31 DIAGNOSIS — Z853 Personal history of malignant neoplasm of breast: Secondary | ICD-10-CM | POA: Diagnosis not present

## 2020-03-31 DIAGNOSIS — E039 Hypothyroidism, unspecified: Secondary | ICD-10-CM | POA: Diagnosis not present

## 2020-03-31 DIAGNOSIS — I890 Lymphedema, not elsewhere classified: Secondary | ICD-10-CM | POA: Diagnosis not present

## 2020-03-31 DIAGNOSIS — Z0000 Encounter for general adult medical examination without abnormal findings: Secondary | ICD-10-CM | POA: Diagnosis not present

## 2020-03-31 DIAGNOSIS — I10 Essential (primary) hypertension: Secondary | ICD-10-CM | POA: Diagnosis not present

## 2020-05-23 DIAGNOSIS — N390 Urinary tract infection, site not specified: Secondary | ICD-10-CM | POA: Diagnosis not present

## 2020-05-23 DIAGNOSIS — R300 Dysuria: Secondary | ICD-10-CM | POA: Diagnosis not present

## 2020-06-11 DIAGNOSIS — M9905 Segmental and somatic dysfunction of pelvic region: Secondary | ICD-10-CM | POA: Diagnosis not present

## 2020-06-11 DIAGNOSIS — M9902 Segmental and somatic dysfunction of thoracic region: Secondary | ICD-10-CM | POA: Diagnosis not present

## 2020-06-11 DIAGNOSIS — M5137 Other intervertebral disc degeneration, lumbosacral region: Secondary | ICD-10-CM | POA: Diagnosis not present

## 2020-06-11 DIAGNOSIS — M9903 Segmental and somatic dysfunction of lumbar region: Secondary | ICD-10-CM | POA: Diagnosis not present

## 2020-06-11 DIAGNOSIS — M5417 Radiculopathy, lumbosacral region: Secondary | ICD-10-CM | POA: Diagnosis not present

## 2020-06-13 DIAGNOSIS — M9902 Segmental and somatic dysfunction of thoracic region: Secondary | ICD-10-CM | POA: Diagnosis not present

## 2020-06-13 DIAGNOSIS — M9903 Segmental and somatic dysfunction of lumbar region: Secondary | ICD-10-CM | POA: Diagnosis not present

## 2020-06-13 DIAGNOSIS — M5417 Radiculopathy, lumbosacral region: Secondary | ICD-10-CM | POA: Diagnosis not present

## 2020-06-13 DIAGNOSIS — M9905 Segmental and somatic dysfunction of pelvic region: Secondary | ICD-10-CM | POA: Diagnosis not present

## 2020-06-13 DIAGNOSIS — M5137 Other intervertebral disc degeneration, lumbosacral region: Secondary | ICD-10-CM | POA: Diagnosis not present

## 2020-06-17 DIAGNOSIS — M9902 Segmental and somatic dysfunction of thoracic region: Secondary | ICD-10-CM | POA: Diagnosis not present

## 2020-06-17 DIAGNOSIS — M5417 Radiculopathy, lumbosacral region: Secondary | ICD-10-CM | POA: Diagnosis not present

## 2020-06-17 DIAGNOSIS — M9903 Segmental and somatic dysfunction of lumbar region: Secondary | ICD-10-CM | POA: Diagnosis not present

## 2020-06-17 DIAGNOSIS — M9905 Segmental and somatic dysfunction of pelvic region: Secondary | ICD-10-CM | POA: Diagnosis not present

## 2020-06-17 DIAGNOSIS — M5137 Other intervertebral disc degeneration, lumbosacral region: Secondary | ICD-10-CM | POA: Diagnosis not present

## 2020-06-24 DIAGNOSIS — M5137 Other intervertebral disc degeneration, lumbosacral region: Secondary | ICD-10-CM | POA: Diagnosis not present

## 2020-06-24 DIAGNOSIS — M5417 Radiculopathy, lumbosacral region: Secondary | ICD-10-CM | POA: Diagnosis not present

## 2020-06-24 DIAGNOSIS — M9903 Segmental and somatic dysfunction of lumbar region: Secondary | ICD-10-CM | POA: Diagnosis not present

## 2020-06-24 DIAGNOSIS — M9905 Segmental and somatic dysfunction of pelvic region: Secondary | ICD-10-CM | POA: Diagnosis not present

## 2020-06-24 DIAGNOSIS — M9902 Segmental and somatic dysfunction of thoracic region: Secondary | ICD-10-CM | POA: Diagnosis not present

## 2020-09-21 DIAGNOSIS — Z803 Family history of malignant neoplasm of breast: Secondary | ICD-10-CM | POA: Diagnosis not present

## 2020-09-21 DIAGNOSIS — Z1231 Encounter for screening mammogram for malignant neoplasm of breast: Secondary | ICD-10-CM | POA: Diagnosis not present

## 2020-09-22 DIAGNOSIS — E559 Vitamin D deficiency, unspecified: Secondary | ICD-10-CM | POA: Diagnosis not present

## 2020-09-22 DIAGNOSIS — I739 Peripheral vascular disease, unspecified: Secondary | ICD-10-CM | POA: Diagnosis not present

## 2020-09-22 DIAGNOSIS — E039 Hypothyroidism, unspecified: Secondary | ICD-10-CM | POA: Diagnosis not present

## 2020-09-22 DIAGNOSIS — I10 Essential (primary) hypertension: Secondary | ICD-10-CM | POA: Diagnosis not present

## 2020-09-23 DIAGNOSIS — L821 Other seborrheic keratosis: Secondary | ICD-10-CM | POA: Diagnosis not present

## 2020-09-23 DIAGNOSIS — L905 Scar conditions and fibrosis of skin: Secondary | ICD-10-CM | POA: Diagnosis not present

## 2020-09-23 DIAGNOSIS — Z85828 Personal history of other malignant neoplasm of skin: Secondary | ICD-10-CM | POA: Diagnosis not present

## 2020-09-23 DIAGNOSIS — D225 Melanocytic nevi of trunk: Secondary | ICD-10-CM | POA: Diagnosis not present

## 2020-09-23 DIAGNOSIS — L814 Other melanin hyperpigmentation: Secondary | ICD-10-CM | POA: Diagnosis not present

## 2020-09-29 DIAGNOSIS — I10 Essential (primary) hypertension: Secondary | ICD-10-CM | POA: Diagnosis not present

## 2020-09-29 DIAGNOSIS — I8390 Asymptomatic varicose veins of unspecified lower extremity: Secondary | ICD-10-CM | POA: Diagnosis not present

## 2020-09-29 DIAGNOSIS — E039 Hypothyroidism, unspecified: Secondary | ICD-10-CM | POA: Diagnosis not present

## 2020-09-29 DIAGNOSIS — E559 Vitamin D deficiency, unspecified: Secondary | ICD-10-CM | POA: Diagnosis not present

## 2020-09-29 DIAGNOSIS — I890 Lymphedema, not elsewhere classified: Secondary | ICD-10-CM | POA: Diagnosis not present

## 2021-03-14 DIAGNOSIS — H0102A Squamous blepharitis right eye, upper and lower eyelids: Secondary | ICD-10-CM | POA: Diagnosis not present

## 2021-03-14 DIAGNOSIS — H02834 Dermatochalasis of left upper eyelid: Secondary | ICD-10-CM | POA: Diagnosis not present

## 2021-03-14 DIAGNOSIS — H43822 Vitreomacular adhesion, left eye: Secondary | ICD-10-CM | POA: Diagnosis not present

## 2021-03-14 DIAGNOSIS — H35372 Puckering of macula, left eye: Secondary | ICD-10-CM | POA: Diagnosis not present

## 2021-03-14 DIAGNOSIS — H0102B Squamous blepharitis left eye, upper and lower eyelids: Secondary | ICD-10-CM | POA: Diagnosis not present

## 2021-03-14 DIAGNOSIS — H02831 Dermatochalasis of right upper eyelid: Secondary | ICD-10-CM | POA: Diagnosis not present

## 2021-03-14 DIAGNOSIS — H43812 Vitreous degeneration, left eye: Secondary | ICD-10-CM | POA: Diagnosis not present

## 2021-03-14 DIAGNOSIS — Z961 Presence of intraocular lens: Secondary | ICD-10-CM | POA: Diagnosis not present

## 2021-03-27 DIAGNOSIS — E785 Hyperlipidemia, unspecified: Secondary | ICD-10-CM | POA: Diagnosis not present

## 2021-03-27 DIAGNOSIS — I10 Essential (primary) hypertension: Secondary | ICD-10-CM | POA: Diagnosis not present

## 2021-03-27 DIAGNOSIS — E039 Hypothyroidism, unspecified: Secondary | ICD-10-CM | POA: Diagnosis not present

## 2021-03-27 DIAGNOSIS — R739 Hyperglycemia, unspecified: Secondary | ICD-10-CM | POA: Diagnosis not present

## 2021-04-03 DIAGNOSIS — Z853 Personal history of malignant neoplasm of breast: Secondary | ICD-10-CM | POA: Diagnosis not present

## 2021-04-03 DIAGNOSIS — I890 Lymphedema, not elsewhere classified: Secondary | ICD-10-CM | POA: Diagnosis not present

## 2021-04-03 DIAGNOSIS — E039 Hypothyroidism, unspecified: Secondary | ICD-10-CM | POA: Diagnosis not present

## 2021-04-03 DIAGNOSIS — Z0000 Encounter for general adult medical examination without abnormal findings: Secondary | ICD-10-CM | POA: Diagnosis not present

## 2021-04-03 DIAGNOSIS — E559 Vitamin D deficiency, unspecified: Secondary | ICD-10-CM | POA: Diagnosis not present

## 2021-04-03 DIAGNOSIS — I10 Essential (primary) hypertension: Secondary | ICD-10-CM | POA: Diagnosis not present

## 2021-04-03 DIAGNOSIS — M8580 Other specified disorders of bone density and structure, unspecified site: Secondary | ICD-10-CM | POA: Diagnosis not present

## 2021-04-24 DIAGNOSIS — I739 Peripheral vascular disease, unspecified: Secondary | ICD-10-CM | POA: Diagnosis not present

## 2021-04-24 DIAGNOSIS — L03115 Cellulitis of right lower limb: Secondary | ICD-10-CM | POA: Diagnosis not present

## 2021-04-24 DIAGNOSIS — I890 Lymphedema, not elsewhere classified: Secondary | ICD-10-CM | POA: Diagnosis not present

## 2021-05-04 DIAGNOSIS — C44622 Squamous cell carcinoma of skin of right upper limb, including shoulder: Secondary | ICD-10-CM | POA: Diagnosis not present

## 2021-05-04 DIAGNOSIS — D485 Neoplasm of uncertain behavior of skin: Secondary | ICD-10-CM | POA: Diagnosis not present

## 2021-06-06 DIAGNOSIS — L821 Other seborrheic keratosis: Secondary | ICD-10-CM | POA: Diagnosis not present

## 2021-06-06 DIAGNOSIS — C44622 Squamous cell carcinoma of skin of right upper limb, including shoulder: Secondary | ICD-10-CM | POA: Diagnosis not present

## 2021-06-06 DIAGNOSIS — D229 Melanocytic nevi, unspecified: Secondary | ICD-10-CM | POA: Diagnosis not present

## 2021-06-06 DIAGNOSIS — I781 Nevus, non-neoplastic: Secondary | ICD-10-CM | POA: Diagnosis not present

## 2021-08-02 DIAGNOSIS — D122 Benign neoplasm of ascending colon: Secondary | ICD-10-CM | POA: Diagnosis not present

## 2021-08-02 DIAGNOSIS — Z86010 Personal history of colonic polyps: Secondary | ICD-10-CM | POA: Diagnosis not present

## 2021-08-02 DIAGNOSIS — D124 Benign neoplasm of descending colon: Secondary | ICD-10-CM | POA: Diagnosis not present

## 2021-08-02 DIAGNOSIS — K5730 Diverticulosis of large intestine without perforation or abscess without bleeding: Secondary | ICD-10-CM | POA: Diagnosis not present

## 2021-08-02 DIAGNOSIS — K649 Unspecified hemorrhoids: Secondary | ICD-10-CM | POA: Diagnosis not present

## 2021-08-04 DIAGNOSIS — D124 Benign neoplasm of descending colon: Secondary | ICD-10-CM | POA: Diagnosis not present

## 2021-08-04 DIAGNOSIS — D122 Benign neoplasm of ascending colon: Secondary | ICD-10-CM | POA: Diagnosis not present

## 2021-09-04 DIAGNOSIS — M5417 Radiculopathy, lumbosacral region: Secondary | ICD-10-CM | POA: Diagnosis not present

## 2021-09-04 DIAGNOSIS — M5137 Other intervertebral disc degeneration, lumbosacral region: Secondary | ICD-10-CM | POA: Diagnosis not present

## 2021-09-04 DIAGNOSIS — M9902 Segmental and somatic dysfunction of thoracic region: Secondary | ICD-10-CM | POA: Diagnosis not present

## 2021-09-04 DIAGNOSIS — M9903 Segmental and somatic dysfunction of lumbar region: Secondary | ICD-10-CM | POA: Diagnosis not present

## 2021-09-04 DIAGNOSIS — M9905 Segmental and somatic dysfunction of pelvic region: Secondary | ICD-10-CM | POA: Diagnosis not present

## 2021-09-05 DIAGNOSIS — M9903 Segmental and somatic dysfunction of lumbar region: Secondary | ICD-10-CM | POA: Diagnosis not present

## 2021-09-05 DIAGNOSIS — M5417 Radiculopathy, lumbosacral region: Secondary | ICD-10-CM | POA: Diagnosis not present

## 2021-09-05 DIAGNOSIS — M5137 Other intervertebral disc degeneration, lumbosacral region: Secondary | ICD-10-CM | POA: Diagnosis not present

## 2021-09-05 DIAGNOSIS — M9905 Segmental and somatic dysfunction of pelvic region: Secondary | ICD-10-CM | POA: Diagnosis not present

## 2021-09-05 DIAGNOSIS — M9902 Segmental and somatic dysfunction of thoracic region: Secondary | ICD-10-CM | POA: Diagnosis not present

## 2021-09-12 DIAGNOSIS — M5417 Radiculopathy, lumbosacral region: Secondary | ICD-10-CM | POA: Diagnosis not present

## 2021-09-12 DIAGNOSIS — M5137 Other intervertebral disc degeneration, lumbosacral region: Secondary | ICD-10-CM | POA: Diagnosis not present

## 2021-09-12 DIAGNOSIS — M9902 Segmental and somatic dysfunction of thoracic region: Secondary | ICD-10-CM | POA: Diagnosis not present

## 2021-09-12 DIAGNOSIS — C50912 Malignant neoplasm of unspecified site of left female breast: Secondary | ICD-10-CM | POA: Diagnosis not present

## 2021-09-12 DIAGNOSIS — M9903 Segmental and somatic dysfunction of lumbar region: Secondary | ICD-10-CM | POA: Diagnosis not present

## 2021-09-12 DIAGNOSIS — M9905 Segmental and somatic dysfunction of pelvic region: Secondary | ICD-10-CM | POA: Diagnosis not present

## 2021-09-14 DIAGNOSIS — M9903 Segmental and somatic dysfunction of lumbar region: Secondary | ICD-10-CM | POA: Diagnosis not present

## 2021-09-14 DIAGNOSIS — M9902 Segmental and somatic dysfunction of thoracic region: Secondary | ICD-10-CM | POA: Diagnosis not present

## 2021-09-14 DIAGNOSIS — M5137 Other intervertebral disc degeneration, lumbosacral region: Secondary | ICD-10-CM | POA: Diagnosis not present

## 2021-09-14 DIAGNOSIS — M9905 Segmental and somatic dysfunction of pelvic region: Secondary | ICD-10-CM | POA: Diagnosis not present

## 2021-09-14 DIAGNOSIS — M5417 Radiculopathy, lumbosacral region: Secondary | ICD-10-CM | POA: Diagnosis not present

## 2021-09-21 DIAGNOSIS — M9905 Segmental and somatic dysfunction of pelvic region: Secondary | ICD-10-CM | POA: Diagnosis not present

## 2021-09-21 DIAGNOSIS — M9902 Segmental and somatic dysfunction of thoracic region: Secondary | ICD-10-CM | POA: Diagnosis not present

## 2021-09-21 DIAGNOSIS — M5417 Radiculopathy, lumbosacral region: Secondary | ICD-10-CM | POA: Diagnosis not present

## 2021-09-21 DIAGNOSIS — M9903 Segmental and somatic dysfunction of lumbar region: Secondary | ICD-10-CM | POA: Diagnosis not present

## 2021-09-21 DIAGNOSIS — M5137 Other intervertebral disc degeneration, lumbosacral region: Secondary | ICD-10-CM | POA: Diagnosis not present

## 2021-09-25 DIAGNOSIS — L570 Actinic keratosis: Secondary | ICD-10-CM | POA: Diagnosis not present

## 2021-09-25 DIAGNOSIS — L814 Other melanin hyperpigmentation: Secondary | ICD-10-CM | POA: Diagnosis not present

## 2021-09-25 DIAGNOSIS — D225 Melanocytic nevi of trunk: Secondary | ICD-10-CM | POA: Diagnosis not present

## 2021-09-25 DIAGNOSIS — Z08 Encounter for follow-up examination after completed treatment for malignant neoplasm: Secondary | ICD-10-CM | POA: Diagnosis not present

## 2021-09-25 DIAGNOSIS — L821 Other seborrheic keratosis: Secondary | ICD-10-CM | POA: Diagnosis not present

## 2021-09-25 DIAGNOSIS — Z85828 Personal history of other malignant neoplasm of skin: Secondary | ICD-10-CM | POA: Diagnosis not present

## 2021-09-26 DIAGNOSIS — I10 Essential (primary) hypertension: Secondary | ICD-10-CM | POA: Diagnosis not present

## 2021-09-26 DIAGNOSIS — C50912 Malignant neoplasm of unspecified site of left female breast: Secondary | ICD-10-CM | POA: Diagnosis not present

## 2021-09-26 DIAGNOSIS — E039 Hypothyroidism, unspecified: Secondary | ICD-10-CM | POA: Diagnosis not present

## 2021-09-26 DIAGNOSIS — E785 Hyperlipidemia, unspecified: Secondary | ICD-10-CM | POA: Diagnosis not present

## 2021-09-26 DIAGNOSIS — E559 Vitamin D deficiency, unspecified: Secondary | ICD-10-CM | POA: Diagnosis not present

## 2021-09-27 DIAGNOSIS — Z1231 Encounter for screening mammogram for malignant neoplasm of breast: Secondary | ICD-10-CM | POA: Diagnosis not present

## 2021-10-18 DIAGNOSIS — R739 Hyperglycemia, unspecified: Secondary | ICD-10-CM | POA: Diagnosis not present

## 2021-10-18 DIAGNOSIS — Z0183 Encounter for blood typing: Secondary | ICD-10-CM | POA: Diagnosis not present

## 2021-10-18 DIAGNOSIS — E039 Hypothyroidism, unspecified: Secondary | ICD-10-CM | POA: Diagnosis not present

## 2021-10-18 DIAGNOSIS — E785 Hyperlipidemia, unspecified: Secondary | ICD-10-CM | POA: Diagnosis not present

## 2021-10-18 DIAGNOSIS — M8589 Other specified disorders of bone density and structure, multiple sites: Secondary | ICD-10-CM | POA: Diagnosis not present

## 2021-10-18 DIAGNOSIS — L03115 Cellulitis of right lower limb: Secondary | ICD-10-CM | POA: Diagnosis not present

## 2021-10-18 DIAGNOSIS — I10 Essential (primary) hypertension: Secondary | ICD-10-CM | POA: Diagnosis not present

## 2021-10-18 DIAGNOSIS — M8580 Other specified disorders of bone density and structure, unspecified site: Secondary | ICD-10-CM | POA: Diagnosis not present

## 2021-10-18 DIAGNOSIS — E559 Vitamin D deficiency, unspecified: Secondary | ICD-10-CM | POA: Diagnosis not present

## 2021-12-16 ENCOUNTER — Encounter (HOSPITAL_BASED_OUTPATIENT_CLINIC_OR_DEPARTMENT_OTHER): Payer: Self-pay | Admitting: Emergency Medicine

## 2021-12-16 ENCOUNTER — Emergency Department (HOSPITAL_BASED_OUTPATIENT_CLINIC_OR_DEPARTMENT_OTHER)
Admission: EM | Admit: 2021-12-16 | Discharge: 2021-12-16 | Disposition: A | Discharge status: Home / Self Care | Payer: Medicare Other | Attending: Emergency Medicine | Admitting: Emergency Medicine

## 2021-12-16 ENCOUNTER — Other Ambulatory Visit: Payer: Self-pay

## 2021-12-16 DIAGNOSIS — R21 Rash and other nonspecific skin eruption: Secondary | ICD-10-CM | POA: Diagnosis present

## 2021-12-16 DIAGNOSIS — L237 Allergic contact dermatitis due to plants, except food: Secondary | ICD-10-CM

## 2021-12-16 DIAGNOSIS — Z79899 Other long term (current) drug therapy: Secondary | ICD-10-CM | POA: Diagnosis not present

## 2021-12-16 DIAGNOSIS — Z7982 Long term (current) use of aspirin: Secondary | ICD-10-CM | POA: Diagnosis not present

## 2021-12-16 NOTE — Discharge Instructions (Signed)
This will typically go away on its own.  It is a reaction of your body touching the plant oils.  It may seem like it spreading but it is really just areas that you touch the plant they are a little bit more than you have touched other places.  I personally like Caladryl, this is the proprietary product that is a mixture of calamine lotion and Benadryl cream.  You can also take an oral antihistamine, Benadryl works really well but can make you sleepy.  If you have trouble with that then you can try one of the nonsedating antihistamines like Claritin, Zyrtec, Xyzal, Allegra. ? ?Steroids are indicated in poison ivy but only if it covers a large area of the body or if it is on your face or genitals.  Please let your family doctor know if this does occur or come back to the emergency department. ?

## 2021-12-16 NOTE — ED Triage Notes (Signed)
Pt arrives pov, ambulatory to triage with c/o itchy rash x 3 days bilaterally arms. ?

## 2021-12-16 NOTE — ED Provider Notes (Signed)
?[place] EMERGENCY DEPARTMENT ?Provider Note ? ? ?CSN: [id number] ?Arrival date & time: 12/16/21  1728 ? ?  ? ?History ? ?Chief Complaint  ?Patient presents with  ? Rash  ? ? ?Cheryl [name] is a 76 y.o. female. ? ?76 yo F with a chief complaint of a itchy rash to bilateral inner arms.  Is been going on for couple days.  She has tried antibiotic ointments without significant improvement.  Feels like it is getting worse and is more itchy.  She did trim back some bushes a few days ago. ? ? ?Rash ? ?  ? ?Home Medications ?Prior to Admission medications   ?Medication Sig Start Date End Date Taking? Authorizing Provider  ?aspirin 81 MG tablet Take 81 mg by mouth daily.      [provider]  ?Calcium Carbonate (CALCIUM 600 PO) Take by mouth.    [provider]  ?Cholecalciferol (VITAMIN D-3 PO) Take 5,000 Int'l Units by mouth.    [provider]  ?Cinnamon 500 MG capsule Take 1,000 mg by mouth daily.      [provider]  ?Flaxseed, Linseed, 1000 MG CAPS Take by mouth 1 dose over 46 hours.      [provider]  ?levothyroxine (SYNTHROID, LEVOTHROID) 75 MCG tablet Take 75 mcg by mouth daily. 12/29/17   [provider]  ?lisinopril-hydrochlorothiazide (PRINZIDE,ZESTORETIC) 10-12.5 MG tablet Take 1 tablet by mouth daily. 01/01/18   [provider]  ?UNABLE TO FIND L8000-Post-Surgical Bras-6  ?W4315- Silicone Breast Prosthesis-1 ?Dx: 174.9 Partial Mastectomy with left reduction 02/17/14   Alphonsa Overall, MD  ?   ? ?Allergies    ?Patient has no known allergies.   ? ?Review of Systems   ?Review of Systems  ?Skin:  Positive for rash.  ? ?Physical Exam ?Updated Vital Signs ?BP (!) 158/78 (BP Location: Right Arm)   Pulse 72   Temp 97.8 ?F (36.6 ?C) (Oral)   Resp 16   Ht 5' (1.524 m)   Wt 56.7 kg   SpO2 100%   BMI 24.41 kg/m?  ?Physical Exam ?Vitals and nursing note reviewed.  ?Constitutional:   ?   General: She is not in acute distress. ?   Appearance: She  is well-developed. She is not diaphoretic.  ?HENT:  ?   Head: Normocephalic and atraumatic.  ?Eyes:  ?   Pupils: Pupils are equal, round, and reactive to light.  ?Cardiovascular:  ?   Rate and Rhythm: Normal rate and regular rhythm.  ?   Heart sounds: No murmur heard. ?  No friction rub. No gallop.  ?Pulmonary:  ?   Effort: Pulmonary effort is normal.  ?   Breath sounds: No wheezing or rales.  ?Abdominal:  ?   General: There is no distension.  ?   Palpations: Abdomen is soft.  ?   Tenderness: There is no abdominal tenderness.  ?Musculoskeletal:     ?   General: No tenderness.  ?   Cervical back: Normal range of motion and neck supple.  ?   Comments: Pruritic vesicular rash on the inner aspect of bilateral upper arms.  ?Skin: ?   General: Skin is warm and dry.  ?Neurological:  ?   Mental Status: She is alert and oriented to person, place, and time.  ?Psychiatric:     ?   Behavior: Behavior normal.  ? ? ?ED Results / Procedures / Treatments   ?Labs ?(all labs ordered are listed, but only abnormal results are  displayed) ?Labs Reviewed - No data to display ? ?EKG ?None ? ?Radiology ?No results found. ? ?Procedures ?Procedures  ? ? ?Medications Ordered in ED ?Medications - No data to display ? ?ED Course/ Medical Decision Making/ A&P ?  ?                        ?Medical Decision Making ? ?76 yo F with a chief complaint of an itchy rash.  Mostly to the patient has poison ivy based on history and physical.  Will have her use topical agents at home.  We will have her follow-up with her family doctor. ? ?7:13 PM:  I have discussed the diagnosis/risks/treatment options with the patient.  Evaluation and diagnostic testing in the emergency department does not suggest an emergent condition requiring admission or immediate intervention beyond what has been performed at this time.  They will follow up with  PCP. We also discussed returning to the ED immediately if new or worsening sx occur. We discussed the sx which are most  concerning (e.g., sudden worsening pain, fever, inability to tolerate by mouth) that necessitate immediate return. Medications administered to the patient during their visit and any new prescriptions provided to the patient are listed below. ? ?Medications given during this visit ?Medications - No data to display ? ? ?The patient appears reasonably screen and/or stabilized for discharge and I doubt any other medical condition or other [facility] requiring further screening, evaluation, or treatment in the ED at this time prior to discharge.  ? ? ? ? ? ? ? ? ?Final Clinical Impression(s) / ED Diagnoses ?Final diagnoses:  ?Poison ivy dermatitis  ? ? ?Rx / DC Orders ?ED Discharge Orders   ? ? None  ? ?  ? ? ?  ?Deno Etienne, DO ?12/16/21 1913 ? ?

## 2022-01-31 DIAGNOSIS — L298 Other pruritus: Secondary | ICD-10-CM | POA: Diagnosis not present

## 2022-01-31 DIAGNOSIS — L239 Allergic contact dermatitis, unspecified cause: Secondary | ICD-10-CM | POA: Diagnosis not present

## 2022-01-31 DIAGNOSIS — T887XXA Unspecified adverse effect of drug or medicament, initial encounter: Secondary | ICD-10-CM | POA: Diagnosis not present

## 2022-03-20 DIAGNOSIS — H35372 Puckering of macula, left eye: Secondary | ICD-10-CM | POA: Diagnosis not present

## 2022-03-20 DIAGNOSIS — H02831 Dermatochalasis of right upper eyelid: Secondary | ICD-10-CM | POA: Diagnosis not present

## 2022-03-20 DIAGNOSIS — H43822 Vitreomacular adhesion, left eye: Secondary | ICD-10-CM | POA: Diagnosis not present

## 2022-03-20 DIAGNOSIS — H0102B Squamous blepharitis left eye, upper and lower eyelids: Secondary | ICD-10-CM | POA: Diagnosis not present

## 2022-03-20 DIAGNOSIS — H0102A Squamous blepharitis right eye, upper and lower eyelids: Secondary | ICD-10-CM | POA: Diagnosis not present

## 2022-03-20 DIAGNOSIS — H43812 Vitreous degeneration, left eye: Secondary | ICD-10-CM | POA: Diagnosis not present

## 2022-03-20 DIAGNOSIS — H02834 Dermatochalasis of left upper eyelid: Secondary | ICD-10-CM | POA: Diagnosis not present

## 2022-04-18 DIAGNOSIS — Z0183 Encounter for blood typing: Secondary | ICD-10-CM | POA: Diagnosis not present

## 2022-04-18 DIAGNOSIS — I10 Essential (primary) hypertension: Secondary | ICD-10-CM | POA: Diagnosis not present

## 2022-04-18 DIAGNOSIS — E785 Hyperlipidemia, unspecified: Secondary | ICD-10-CM | POA: Diagnosis not present

## 2022-04-18 DIAGNOSIS — E559 Vitamin D deficiency, unspecified: Secondary | ICD-10-CM | POA: Diagnosis not present

## 2022-04-18 DIAGNOSIS — E039 Hypothyroidism, unspecified: Secondary | ICD-10-CM | POA: Diagnosis not present

## 2022-04-18 DIAGNOSIS — R739 Hyperglycemia, unspecified: Secondary | ICD-10-CM | POA: Diagnosis not present

## 2022-04-24 DIAGNOSIS — Z853 Personal history of malignant neoplasm of breast: Secondary | ICD-10-CM | POA: Diagnosis not present

## 2022-04-24 DIAGNOSIS — Z0000 Encounter for general adult medical examination without abnormal findings: Secondary | ICD-10-CM | POA: Diagnosis not present

## 2022-04-24 DIAGNOSIS — M8580 Other specified disorders of bone density and structure, unspecified site: Secondary | ICD-10-CM | POA: Diagnosis not present

## 2022-04-24 DIAGNOSIS — I739 Peripheral vascular disease, unspecified: Secondary | ICD-10-CM | POA: Diagnosis not present

## 2022-04-24 DIAGNOSIS — E039 Hypothyroidism, unspecified: Secondary | ICD-10-CM | POA: Diagnosis not present

## 2022-04-24 DIAGNOSIS — E785 Hyperlipidemia, unspecified: Secondary | ICD-10-CM | POA: Diagnosis not present

## 2022-04-24 DIAGNOSIS — I10 Essential (primary) hypertension: Secondary | ICD-10-CM | POA: Diagnosis not present

## 2022-08-06 DIAGNOSIS — Z23 Encounter for immunization: Secondary | ICD-10-CM | POA: Diagnosis not present

## 2022-09-25 DIAGNOSIS — L814 Other melanin hyperpigmentation: Secondary | ICD-10-CM | POA: Diagnosis not present

## 2022-09-25 DIAGNOSIS — L821 Other seborrheic keratosis: Secondary | ICD-10-CM | POA: Diagnosis not present

## 2022-09-25 DIAGNOSIS — D225 Melanocytic nevi of trunk: Secondary | ICD-10-CM | POA: Diagnosis not present

## 2022-09-25 DIAGNOSIS — Z85828 Personal history of other malignant neoplasm of skin: Secondary | ICD-10-CM | POA: Diagnosis not present

## 2022-09-25 DIAGNOSIS — L2089 Other atopic dermatitis: Secondary | ICD-10-CM | POA: Diagnosis not present

## 2022-09-25 DIAGNOSIS — Z08 Encounter for follow-up examination after completed treatment for malignant neoplasm: Secondary | ICD-10-CM | POA: Diagnosis not present

## 2022-09-25 DIAGNOSIS — L309 Dermatitis, unspecified: Secondary | ICD-10-CM | POA: Diagnosis not present

## 2022-10-01 DIAGNOSIS — Z1231 Encounter for screening mammogram for malignant neoplasm of breast: Secondary | ICD-10-CM | POA: Diagnosis not present

## 2022-10-16 DIAGNOSIS — E785 Hyperlipidemia, unspecified: Secondary | ICD-10-CM | POA: Diagnosis not present

## 2022-10-16 DIAGNOSIS — E039 Hypothyroidism, unspecified: Secondary | ICD-10-CM | POA: Diagnosis not present

## 2022-10-16 DIAGNOSIS — M8580 Other specified disorders of bone density and structure, unspecified site: Secondary | ICD-10-CM | POA: Diagnosis not present

## 2022-10-16 DIAGNOSIS — I10 Essential (primary) hypertension: Secondary | ICD-10-CM | POA: Diagnosis not present

## 2022-10-16 DIAGNOSIS — E559 Vitamin D deficiency, unspecified: Secondary | ICD-10-CM | POA: Diagnosis not present

## 2022-10-23 DIAGNOSIS — R739 Hyperglycemia, unspecified: Secondary | ICD-10-CM | POA: Diagnosis not present

## 2022-10-23 DIAGNOSIS — M8580 Other specified disorders of bone density and structure, unspecified site: Secondary | ICD-10-CM | POA: Diagnosis not present

## 2022-10-23 DIAGNOSIS — I739 Peripheral vascular disease, unspecified: Secondary | ICD-10-CM | POA: Diagnosis not present

## 2022-10-23 DIAGNOSIS — I10 Essential (primary) hypertension: Secondary | ICD-10-CM | POA: Diagnosis not present

## 2022-10-23 DIAGNOSIS — E559 Vitamin D deficiency, unspecified: Secondary | ICD-10-CM | POA: Diagnosis not present

## 2022-10-23 DIAGNOSIS — E039 Hypothyroidism, unspecified: Secondary | ICD-10-CM | POA: Diagnosis not present

## 2022-10-23 DIAGNOSIS — E785 Hyperlipidemia, unspecified: Secondary | ICD-10-CM | POA: Diagnosis not present

## 2022-10-23 DIAGNOSIS — I890 Lymphedema, not elsewhere classified: Secondary | ICD-10-CM | POA: Diagnosis not present

## 2022-10-23 DIAGNOSIS — Z853 Personal history of malignant neoplasm of breast: Secondary | ICD-10-CM | POA: Diagnosis not present

## 2022-12-06 DIAGNOSIS — D2271 Melanocytic nevi of right lower limb, including hip: Secondary | ICD-10-CM | POA: Diagnosis not present

## 2022-12-06 DIAGNOSIS — L2084 Intrinsic (allergic) eczema: Secondary | ICD-10-CM | POA: Diagnosis not present

## 2022-12-06 DIAGNOSIS — D225 Melanocytic nevi of trunk: Secondary | ICD-10-CM | POA: Diagnosis not present

## 2022-12-06 DIAGNOSIS — D2371 Other benign neoplasm of skin of right lower limb, including hip: Secondary | ICD-10-CM | POA: Diagnosis not present

## 2022-12-06 DIAGNOSIS — L570 Actinic keratosis: Secondary | ICD-10-CM | POA: Diagnosis not present

## 2022-12-06 DIAGNOSIS — D2239 Melanocytic nevi of other parts of face: Secondary | ICD-10-CM | POA: Diagnosis not present

## 2022-12-06 DIAGNOSIS — L821 Other seborrheic keratosis: Secondary | ICD-10-CM | POA: Diagnosis not present

## 2022-12-06 DIAGNOSIS — D224 Melanocytic nevi of scalp and neck: Secondary | ICD-10-CM | POA: Diagnosis not present

## 2022-12-06 DIAGNOSIS — D485 Neoplasm of uncertain behavior of skin: Secondary | ICD-10-CM | POA: Diagnosis not present

## 2022-12-06 DIAGNOSIS — L738 Other specified follicular disorders: Secondary | ICD-10-CM | POA: Diagnosis not present

## 2023-01-31 DIAGNOSIS — L309 Dermatitis, unspecified: Secondary | ICD-10-CM | POA: Diagnosis not present

## 2023-04-09 DIAGNOSIS — H43812 Vitreous degeneration, left eye: Secondary | ICD-10-CM | POA: Diagnosis not present

## 2023-04-09 DIAGNOSIS — H35372 Puckering of macula, left eye: Secondary | ICD-10-CM | POA: Diagnosis not present

## 2023-04-09 DIAGNOSIS — H0102B Squamous blepharitis left eye, upper and lower eyelids: Secondary | ICD-10-CM | POA: Diagnosis not present

## 2023-04-09 DIAGNOSIS — H02831 Dermatochalasis of right upper eyelid: Secondary | ICD-10-CM | POA: Diagnosis not present

## 2023-04-09 DIAGNOSIS — Z961 Presence of intraocular lens: Secondary | ICD-10-CM | POA: Diagnosis not present

## 2023-04-09 DIAGNOSIS — H43822 Vitreomacular adhesion, left eye: Secondary | ICD-10-CM | POA: Diagnosis not present

## 2023-04-09 DIAGNOSIS — H02834 Dermatochalasis of left upper eyelid: Secondary | ICD-10-CM | POA: Diagnosis not present

## 2023-04-09 DIAGNOSIS — H0102A Squamous blepharitis right eye, upper and lower eyelids: Secondary | ICD-10-CM | POA: Diagnosis not present

## 2023-04-30 DIAGNOSIS — E785 Hyperlipidemia, unspecified: Secondary | ICD-10-CM | POA: Diagnosis not present

## 2023-04-30 DIAGNOSIS — I890 Lymphedema, not elsewhere classified: Secondary | ICD-10-CM | POA: Diagnosis not present

## 2023-04-30 DIAGNOSIS — I10 Essential (primary) hypertension: Secondary | ICD-10-CM | POA: Diagnosis not present

## 2023-04-30 DIAGNOSIS — E559 Vitamin D deficiency, unspecified: Secondary | ICD-10-CM | POA: Diagnosis not present

## 2023-04-30 DIAGNOSIS — Z79899 Other long term (current) drug therapy: Secondary | ICD-10-CM | POA: Diagnosis not present

## 2023-05-07 DIAGNOSIS — Z0000 Encounter for general adult medical examination without abnormal findings: Secondary | ICD-10-CM | POA: Diagnosis not present

## 2023-05-07 DIAGNOSIS — I10 Essential (primary) hypertension: Secondary | ICD-10-CM | POA: Diagnosis not present

## 2023-05-07 DIAGNOSIS — I739 Peripheral vascular disease, unspecified: Secondary | ICD-10-CM | POA: Diagnosis not present

## 2023-05-07 DIAGNOSIS — E785 Hyperlipidemia, unspecified: Secondary | ICD-10-CM | POA: Diagnosis not present

## 2023-05-07 DIAGNOSIS — M8580 Other specified disorders of bone density and structure, unspecified site: Secondary | ICD-10-CM | POA: Diagnosis not present

## 2023-05-07 DIAGNOSIS — E039 Hypothyroidism, unspecified: Secondary | ICD-10-CM | POA: Diagnosis not present

## 2023-05-07 DIAGNOSIS — Z853 Personal history of malignant neoplasm of breast: Secondary | ICD-10-CM | POA: Diagnosis not present

## 2023-05-07 DIAGNOSIS — E559 Vitamin D deficiency, unspecified: Secondary | ICD-10-CM | POA: Diagnosis not present

## 2023-06-04 DIAGNOSIS — M25511 Pain in right shoulder: Secondary | ICD-10-CM | POA: Diagnosis not present

## 2023-07-03 DIAGNOSIS — Z23 Encounter for immunization: Secondary | ICD-10-CM | POA: Diagnosis not present

## 2023-07-05 DIAGNOSIS — Z23 Encounter for immunization: Secondary | ICD-10-CM | POA: Diagnosis not present

## 2023-07-09 DIAGNOSIS — L03113 Cellulitis of right upper limb: Secondary | ICD-10-CM | POA: Diagnosis not present

## 2023-07-09 DIAGNOSIS — L309 Dermatitis, unspecified: Secondary | ICD-10-CM | POA: Diagnosis not present

## 2023-08-01 DIAGNOSIS — C50912 Malignant neoplasm of unspecified site of left female breast: Secondary | ICD-10-CM | POA: Diagnosis not present

## 2023-08-29 DIAGNOSIS — C50912 Malignant neoplasm of unspecified site of left female breast: Secondary | ICD-10-CM | POA: Diagnosis not present

## 2023-10-07 DIAGNOSIS — Z1231 Encounter for screening mammogram for malignant neoplasm of breast: Secondary | ICD-10-CM | POA: Diagnosis not present

## 2023-11-05 DIAGNOSIS — E039 Hypothyroidism, unspecified: Secondary | ICD-10-CM | POA: Diagnosis not present

## 2023-11-05 DIAGNOSIS — E785 Hyperlipidemia, unspecified: Secondary | ICD-10-CM | POA: Diagnosis not present

## 2023-11-05 DIAGNOSIS — I10 Essential (primary) hypertension: Secondary | ICD-10-CM | POA: Diagnosis not present

## 2023-11-05 DIAGNOSIS — M8580 Other specified disorders of bone density and structure, unspecified site: Secondary | ICD-10-CM | POA: Diagnosis not present

## 2023-11-05 DIAGNOSIS — I739 Peripheral vascular disease, unspecified: Secondary | ICD-10-CM | POA: Diagnosis not present

## 2023-11-12 DIAGNOSIS — E785 Hyperlipidemia, unspecified: Secondary | ICD-10-CM | POA: Diagnosis not present

## 2023-11-12 DIAGNOSIS — M8580 Other specified disorders of bone density and structure, unspecified site: Secondary | ICD-10-CM | POA: Diagnosis not present

## 2023-11-12 DIAGNOSIS — E039 Hypothyroidism, unspecified: Secondary | ICD-10-CM | POA: Diagnosis not present

## 2023-11-12 DIAGNOSIS — E559 Vitamin D deficiency, unspecified: Secondary | ICD-10-CM | POA: Diagnosis not present

## 2023-11-12 DIAGNOSIS — R7309 Other abnormal glucose: Secondary | ICD-10-CM | POA: Diagnosis not present

## 2023-11-12 DIAGNOSIS — I739 Peripheral vascular disease, unspecified: Secondary | ICD-10-CM | POA: Diagnosis not present

## 2023-11-12 DIAGNOSIS — I10 Essential (primary) hypertension: Secondary | ICD-10-CM | POA: Diagnosis not present

## 2023-12-10 DIAGNOSIS — I872 Venous insufficiency (chronic) (peripheral): Secondary | ICD-10-CM | POA: Diagnosis not present

## 2023-12-10 DIAGNOSIS — D2371 Other benign neoplasm of skin of right lower limb, including hip: Secondary | ICD-10-CM | POA: Diagnosis not present

## 2023-12-10 DIAGNOSIS — I878 Other specified disorders of veins: Secondary | ICD-10-CM | POA: Diagnosis not present

## 2023-12-10 DIAGNOSIS — D224 Melanocytic nevi of scalp and neck: Secondary | ICD-10-CM | POA: Diagnosis not present

## 2023-12-10 DIAGNOSIS — D485 Neoplasm of uncertain behavior of skin: Secondary | ICD-10-CM | POA: Diagnosis not present

## 2023-12-10 DIAGNOSIS — D2271 Melanocytic nevi of right lower limb, including hip: Secondary | ICD-10-CM | POA: Diagnosis not present

## 2023-12-10 DIAGNOSIS — L821 Other seborrheic keratosis: Secondary | ICD-10-CM | POA: Diagnosis not present

## 2024-04-21 DIAGNOSIS — H35372 Puckering of macula, left eye: Secondary | ICD-10-CM | POA: Diagnosis not present

## 2024-04-21 DIAGNOSIS — Z961 Presence of intraocular lens: Secondary | ICD-10-CM | POA: Diagnosis not present

## 2024-04-21 DIAGNOSIS — H02834 Dermatochalasis of left upper eyelid: Secondary | ICD-10-CM | POA: Diagnosis not present

## 2024-04-21 DIAGNOSIS — H0102B Squamous blepharitis left eye, upper and lower eyelids: Secondary | ICD-10-CM | POA: Diagnosis not present

## 2024-04-21 DIAGNOSIS — H43812 Vitreous degeneration, left eye: Secondary | ICD-10-CM | POA: Diagnosis not present

## 2024-04-21 DIAGNOSIS — H0102A Squamous blepharitis right eye, upper and lower eyelids: Secondary | ICD-10-CM | POA: Diagnosis not present

## 2024-04-21 DIAGNOSIS — H02831 Dermatochalasis of right upper eyelid: Secondary | ICD-10-CM | POA: Diagnosis not present

## 2024-05-05 DIAGNOSIS — M8580 Other specified disorders of bone density and structure, unspecified site: Secondary | ICD-10-CM | POA: Diagnosis not present

## 2024-05-05 DIAGNOSIS — E039 Hypothyroidism, unspecified: Secondary | ICD-10-CM | POA: Diagnosis not present

## 2024-05-05 DIAGNOSIS — E559 Vitamin D deficiency, unspecified: Secondary | ICD-10-CM | POA: Diagnosis not present

## 2024-05-05 DIAGNOSIS — R7309 Other abnormal glucose: Secondary | ICD-10-CM | POA: Diagnosis not present

## 2024-05-05 DIAGNOSIS — I10 Essential (primary) hypertension: Secondary | ICD-10-CM | POA: Diagnosis not present

## 2024-05-05 DIAGNOSIS — E785 Hyperlipidemia, unspecified: Secondary | ICD-10-CM | POA: Diagnosis not present

## 2024-05-12 DIAGNOSIS — Z853 Personal history of malignant neoplasm of breast: Secondary | ICD-10-CM | POA: Diagnosis not present

## 2024-05-12 DIAGNOSIS — E785 Hyperlipidemia, unspecified: Secondary | ICD-10-CM | POA: Diagnosis not present

## 2024-05-12 DIAGNOSIS — M8589 Other specified disorders of bone density and structure, multiple sites: Secondary | ICD-10-CM | POA: Diagnosis not present

## 2024-05-12 DIAGNOSIS — E559 Vitamin D deficiency, unspecified: Secondary | ICD-10-CM | POA: Diagnosis not present

## 2024-05-12 DIAGNOSIS — I890 Lymphedema, not elsewhere classified: Secondary | ICD-10-CM | POA: Diagnosis not present

## 2024-05-12 DIAGNOSIS — E039 Hypothyroidism, unspecified: Secondary | ICD-10-CM | POA: Diagnosis not present

## 2024-05-12 DIAGNOSIS — M8580 Other specified disorders of bone density and structure, unspecified site: Secondary | ICD-10-CM | POA: Diagnosis not present

## 2024-05-12 DIAGNOSIS — I739 Peripheral vascular disease, unspecified: Secondary | ICD-10-CM | POA: Diagnosis not present

## 2024-05-12 DIAGNOSIS — Z0000 Encounter for general adult medical examination without abnormal findings: Secondary | ICD-10-CM | POA: Diagnosis not present

## 2024-05-12 DIAGNOSIS — I10 Essential (primary) hypertension: Secondary | ICD-10-CM | POA: Diagnosis not present

## 2024-05-23 DIAGNOSIS — L259 Unspecified contact dermatitis, unspecified cause: Secondary | ICD-10-CM | POA: Diagnosis not present

## 2024-05-30 DIAGNOSIS — L03115 Cellulitis of right lower limb: Secondary | ICD-10-CM | POA: Diagnosis not present
